# Patient Record
Sex: Male | Born: 1955 | Race: White | Hispanic: No | Marital: Single | Smoking: Former smoker
Health system: Southern US, Community
[De-identification: ages and names within clinical notes are randomized; demographics above are authoritative.]

## PROBLEM LIST (undated history)

## (undated) DIAGNOSIS — I10 Essential (primary) hypertension: Secondary | ICD-10-CM

## (undated) DIAGNOSIS — I639 Cerebral infarction, unspecified: Secondary | ICD-10-CM

## (undated) DIAGNOSIS — F0150 Vascular dementia without behavioral disturbance: Secondary | ICD-10-CM

## (undated) DIAGNOSIS — R4701 Aphasia: Secondary | ICD-10-CM

## (undated) DIAGNOSIS — R1310 Dysphagia, unspecified: Secondary | ICD-10-CM

## (undated) HISTORY — PX: PEG PLACEMENT: SHX5437

---

## 2010-12-12 ENCOUNTER — Emergency Department: Payer: Self-pay | Admitting: Emergency Medicine

## 2011-02-28 ENCOUNTER — Ambulatory Visit: Payer: Self-pay | Admitting: Internal Medicine

## 2011-03-17 ENCOUNTER — Inpatient Hospital Stay: Payer: Self-pay | Admitting: Internal Medicine

## 2011-04-18 ENCOUNTER — Ambulatory Visit: Payer: Self-pay | Admitting: Geriatric Medicine

## 2011-04-23 ENCOUNTER — Emergency Department: Payer: Self-pay | Admitting: Emergency Medicine

## 2011-06-22 ENCOUNTER — Emergency Department: Payer: Self-pay | Admitting: Internal Medicine

## 2011-08-19 ENCOUNTER — Other Ambulatory Visit: Payer: Self-pay | Admitting: Geriatric Medicine

## 2011-08-19 ENCOUNTER — Inpatient Hospital Stay: Payer: Self-pay | Admitting: Internal Medicine

## 2011-09-22 ENCOUNTER — Emergency Department: Payer: Self-pay | Admitting: Emergency Medicine

## 2011-10-17 ENCOUNTER — Emergency Department: Payer: Self-pay | Admitting: *Deleted

## 2011-10-18 ENCOUNTER — Emergency Department: Payer: Self-pay | Admitting: Emergency Medicine

## 2011-10-20 ENCOUNTER — Observation Stay: Payer: Self-pay | Admitting: *Deleted

## 2011-10-20 LAB — CBC
RBC: 4.2 10*6/uL — ABNORMAL LOW (ref 4.40–5.90)
WBC: 13.5 10*3/uL — ABNORMAL HIGH (ref 3.8–10.6)

## 2011-10-20 LAB — COMPREHENSIVE METABOLIC PANEL
Albumin: 3.4 g/dL (ref 3.4–5.0)
Anion Gap: 10 (ref 7–16)
BUN: 29 mg/dL — ABNORMAL HIGH (ref 7–18)
Chloride: 113 mmol/L — ABNORMAL HIGH (ref 98–107)
EGFR (Non-African Amer.): 50 — ABNORMAL LOW
Glucose: 98 mg/dL (ref 65–99)
Osmolality: 309 (ref 275–301)
Potassium: 4.4 mmol/L (ref 3.5–5.1)
Sodium: 153 mmol/L — ABNORMAL HIGH (ref 136–145)
Total Protein: 9.8 g/dL — ABNORMAL HIGH (ref 6.4–8.2)

## 2011-10-21 LAB — CBC WITH DIFFERENTIAL/PLATELET
Basophil #: 0 10*3/uL (ref 0.0–0.1)
Basophil %: 0.3 %
Eosinophil #: 0.1 10*3/uL (ref 0.0–0.7)
Eosinophil %: 0.4 %
HCT: 40.8 % (ref 40.0–52.0)
HGB: 13.7 g/dL (ref 13.0–18.0)
Lymphocyte #: 1.4 10*3/uL (ref 1.0–3.6)
Lymphocyte %: 10.9 %
MCH: 33.9 pg (ref 26.0–34.0)
MCHC: 33.6 g/dL (ref 32.0–36.0)
MCV: 101 fL — ABNORMAL HIGH (ref 80–100)
Monocyte #: 1.3 10*3/uL — ABNORMAL HIGH (ref 0.0–0.7)
Monocyte %: 10 %
Neutrophil #: 9.9 10*3/uL — ABNORMAL HIGH (ref 1.4–6.5)
Neutrophil %: 78.4 %
Platelet: 209 10*3/uL (ref 150–440)
RBC: 4.05 10*6/uL — ABNORMAL LOW (ref 4.40–5.90)
RDW: 13.4 % (ref 11.5–14.5)
WBC: 12.6 10*3/uL — ABNORMAL HIGH (ref 3.8–10.6)

## 2011-10-21 LAB — BASIC METABOLIC PANEL
Anion Gap: 10 (ref 7–16)
BUN: 25 mg/dL — ABNORMAL HIGH (ref 7–18)
Calcium, Total: 9.4 mg/dL (ref 8.5–10.1)
Chloride: 112 mmol/L — ABNORMAL HIGH (ref 98–107)
Co2: 28 mmol/L (ref 21–32)
Creatinine: 1.39 mg/dL — ABNORMAL HIGH (ref 0.60–1.30)
EGFR (African American): >60
EGFR (Non-African Amer.): 56 — ABNORMAL LOW
Glucose: 108 mg/dL — ABNORMAL HIGH (ref 65–99)
Osmolality: 303 (ref 275–301)
Potassium: 3.3 mmol/L — ABNORMAL LOW (ref 3.5–5.1)
Sodium: 150 mmol/L — ABNORMAL HIGH (ref 136–145)

## 2011-10-22 LAB — BASIC METABOLIC PANEL
Anion Gap: 10 (ref 7–16)
BUN: 23 mg/dL — ABNORMAL HIGH (ref 7–18)
Chloride: 106 mmol/L (ref 98–107)
Creatinine: 1.33 mg/dL — ABNORMAL HIGH (ref 0.60–1.30)
EGFR (African American): >60
EGFR (Non-African Amer.): 59 — ABNORMAL LOW

## 2012-03-20 ENCOUNTER — Inpatient Hospital Stay: Payer: Self-pay | Admitting: Internal Medicine

## 2012-03-20 LAB — URINALYSIS, COMPLETE
Bilirubin,UR: NEGATIVE
Glucose,UR: NEGATIVE mg/dL (ref 0–75)
Nitrite: NEGATIVE
Protein: 100
RBC,UR: 11 /HPF (ref 0–5)
WBC UR: 10 /HPF (ref 0–5)

## 2012-03-20 LAB — COMPREHENSIVE METABOLIC PANEL
Albumin: 3.3 g/dL — ABNORMAL LOW (ref 3.4–5.0)
Anion Gap: 12 (ref 7–16)
Bilirubin,Total: 1 mg/dL (ref 0.2–1.0)
Chloride: 110 mmol/L — ABNORMAL HIGH (ref 98–107)
Co2: 23 mmol/L (ref 21–32)
Creatinine: 1.66 mg/dL — ABNORMAL HIGH (ref 0.60–1.30)
EGFR (African American): 53 — ABNORMAL LOW
EGFR (Non-African Amer.): 45 — ABNORMAL LOW
Glucose: 127 mg/dL — ABNORMAL HIGH (ref 65–99)
Osmolality: 297 (ref 275–301)
Potassium: 3.9 mmol/L (ref 3.5–5.1)
SGOT(AST): 28 U/L (ref 15–37)
SGPT (ALT): 18 U/L
Sodium: 145 mmol/L (ref 136–145)

## 2012-03-20 LAB — CBC
MCH: 31.8 pg (ref 26.0–34.0)
Platelet: 256 10*3/uL (ref 150–440)
RBC: 5.28 10*6/uL (ref 4.40–5.90)
RDW: 13.1 % (ref 11.5–14.5)
WBC: 25.1 10*3/uL — ABNORMAL HIGH (ref 3.8–10.6)

## 2012-03-20 LAB — LIPASE, BLOOD: Lipase: 134 U/L (ref 73–393)

## 2012-03-21 LAB — CBC WITH DIFFERENTIAL/PLATELET
Eosinophil %: 0.3 %
HCT: 36 % — ABNORMAL LOW (ref 40.0–52.0)
HGB: 11.7 g/dL — ABNORMAL LOW (ref 13.0–18.0)
Lymphocyte %: 5.6 %
MCV: 99 fL (ref 80–100)
Monocyte #: 1.2 x10 3/mm — ABNORMAL HIGH (ref 0.2–1.0)
Monocyte %: 6 %
Neutrophil #: 17.5 10*3/uL — ABNORMAL HIGH (ref 1.4–6.5)
Neutrophil %: 87.9 %
WBC: 19.9 10*3/uL — ABNORMAL HIGH (ref 3.8–10.6)

## 2012-03-21 LAB — BASIC METABOLIC PANEL
Anion Gap: 4 — ABNORMAL LOW (ref 7–16)
BUN: 22 mg/dL — ABNORMAL HIGH (ref 7–18)
Calcium, Total: 8 mg/dL — ABNORMAL LOW (ref 8.5–10.1)
Chloride: 116 mmol/L — ABNORMAL HIGH (ref 98–107)
Co2: 28 mmol/L (ref 21–32)
Creatinine: 1.27 mg/dL (ref 0.60–1.30)
EGFR (African American): >60
Osmolality: 298 (ref 275–301)

## 2012-03-21 LAB — OCCULT BLOOD X 1 CARD TO LAB, STOOL: Occult Blood, Feces: POSITIVE

## 2012-03-21 LAB — HEMOGLOBIN: HGB: 12 g/dL — ABNORMAL LOW (ref 13.0–18.0)

## 2012-03-21 LAB — CLOSTRIDIUM DIFFICILE BY PCR

## 2012-03-22 LAB — BASIC METABOLIC PANEL
Anion Gap: 9 (ref 7–16)
BUN: 17 mg/dL (ref 7–18)
Chloride: 108 mmol/L — ABNORMAL HIGH (ref 98–107)
Co2: 25 mmol/L (ref 21–32)
Osmolality: 285 (ref 275–301)
Potassium: 3 mmol/L — ABNORMAL LOW (ref 3.5–5.1)
Sodium: 142 mmol/L (ref 136–145)

## 2012-03-22 LAB — CBC WITH DIFFERENTIAL/PLATELET
Basophil #: 0 10*3/uL (ref 0.0–0.1)
Basophil %: 0.4 %
Eosinophil #: 0.1 10*3/uL (ref 0.0–0.7)
HGB: 10.8 g/dL — ABNORMAL LOW (ref 13.0–18.0)
MCH: 34.1 pg — ABNORMAL HIGH (ref 26.0–34.0)
MCHC: 34.8 g/dL (ref 32.0–36.0)
MCV: 98 fL (ref 80–100)
Monocyte #: 0.8 x10 3/mm (ref 0.2–1.0)
Neutrophil %: 82.5 %
Platelet: 148 10*3/uL — ABNORMAL LOW (ref 150–440)
RDW: 12.8 % (ref 11.5–14.5)

## 2012-03-22 LAB — URINE CULTURE

## 2012-03-23 ENCOUNTER — Other Ambulatory Visit: Payer: Self-pay

## 2012-03-23 LAB — CBC WITH DIFFERENTIAL/PLATELET
Basophil #: 0 10*3/uL (ref 0.0–0.1)
Basophil %: 0.2 %
Eosinophil #: 0.1 10*3/uL (ref 0.0–0.7)
HGB: 13.9 g/dL (ref 13.0–18.0)
MCH: 34.1 pg — ABNORMAL HIGH (ref 26.0–34.0)
MCHC: 35.1 g/dL (ref 32.0–36.0)
Monocyte #: 0.9 x10 3/mm (ref 0.2–1.0)
Neutrophil %: 81.1 %
Platelet: 230 10*3/uL (ref 150–440)
RBC: 4.07 10*6/uL — ABNORMAL LOW (ref 4.40–5.90)
RDW: 12.6 % (ref 11.5–14.5)

## 2012-03-23 LAB — BASIC METABOLIC PANEL
BUN: 14 mg/dL (ref 7–18)
Calcium, Total: 8.2 mg/dL — ABNORMAL LOW (ref 8.5–10.1)
Chloride: 106 mmol/L (ref 98–107)
Creatinine: 1.29 mg/dL (ref 0.60–1.30)
EGFR (Non-African Amer.): >60
Glucose: 94 mg/dL (ref 65–99)
Osmolality: 281 (ref 275–301)
Potassium: 4 mmol/L (ref 3.5–5.1)

## 2012-03-23 LAB — STOOL CULTURE

## 2012-05-05 ENCOUNTER — Other Ambulatory Visit: Payer: Self-pay | Admitting: Geriatric Medicine

## 2012-05-05 LAB — CLOSTRIDIUM DIFFICILE BY PCR

## 2013-08-06 ENCOUNTER — Ambulatory Visit: Payer: Self-pay | Admitting: Family Medicine

## 2013-08-25 ENCOUNTER — Emergency Department: Payer: Self-pay | Admitting: Emergency Medicine

## 2013-09-06 IMAGING — CR DG ABDOMEN 1V
1 series · 1 of 1 positions shown · non-contrast
Comparison: none

REASON FOR EXAM: PEG tube placement
COMMENTS:   Bedside (portable):Y

[supine kub]
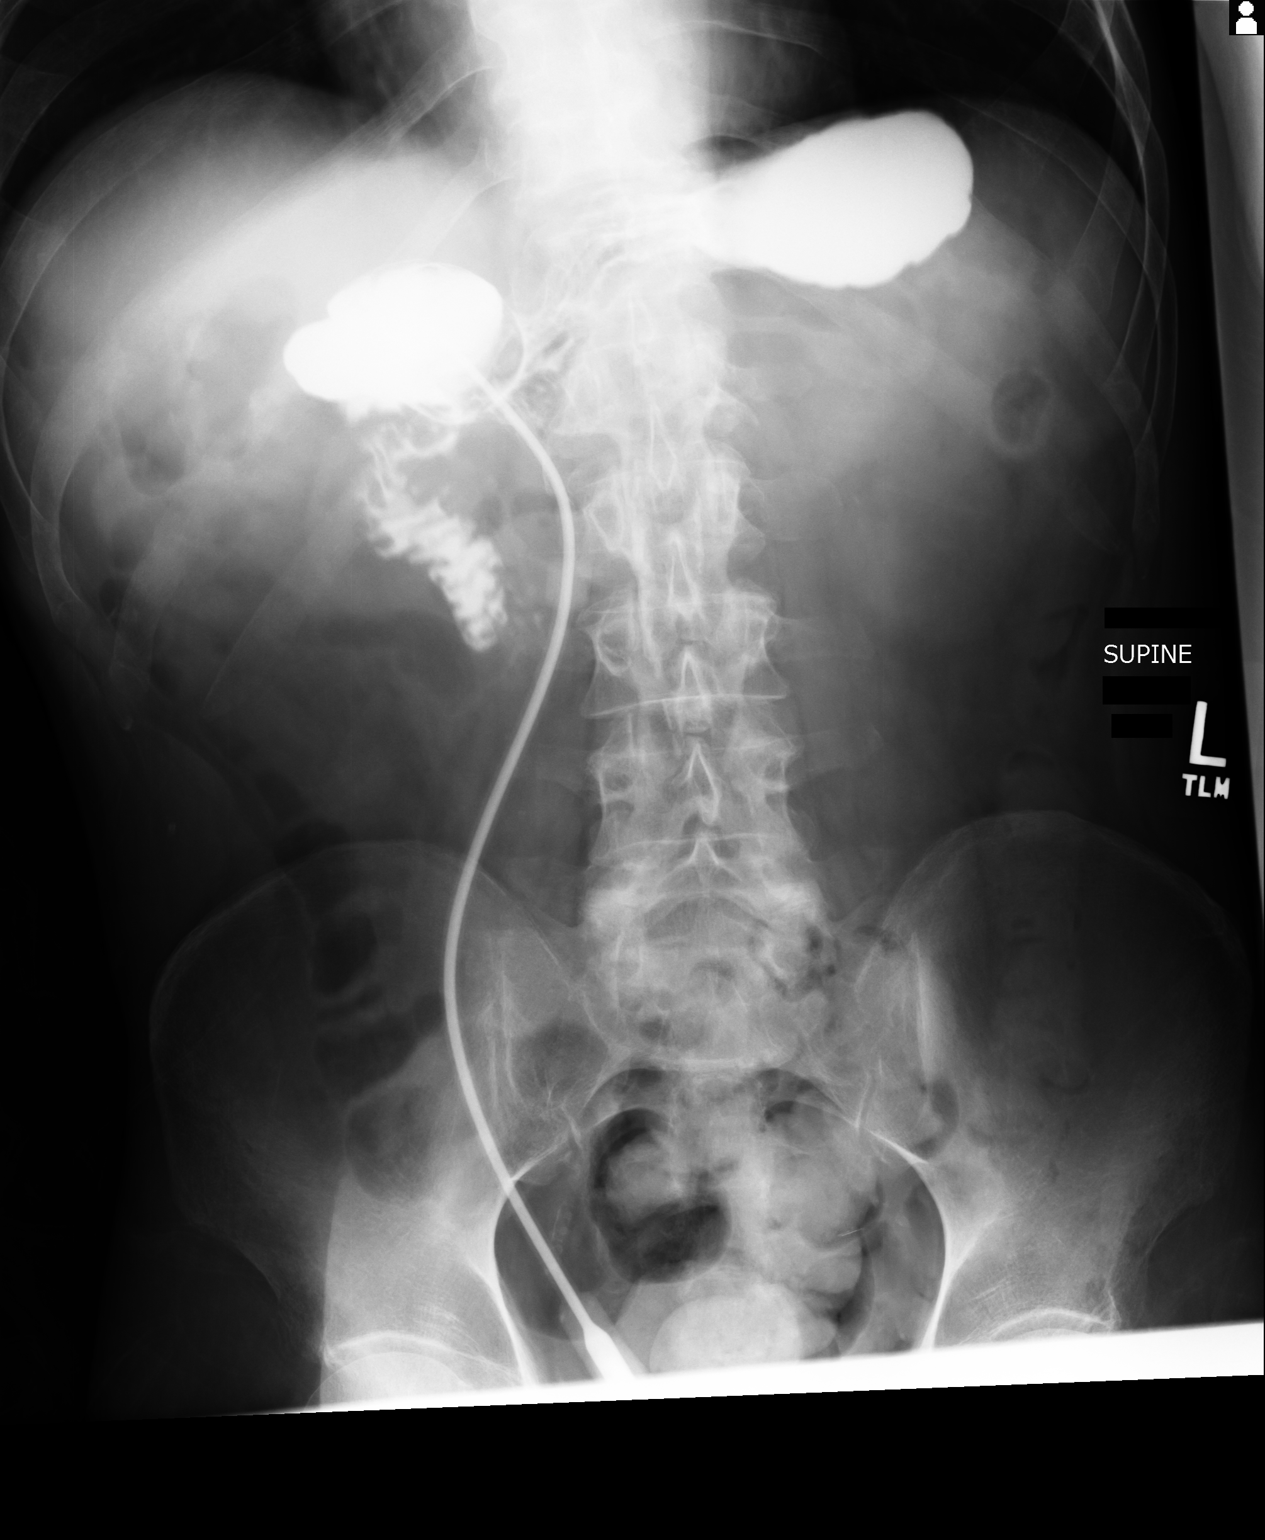

[1 of 1 positions shown; findings below may reference images not displayed]

PROCEDURE:     DXR - DXR KIDNEY URETER BLADDER  - October 18, 2011  [DATE]

RESULT:

Portable AP view of the abdomen was obtained. A gastrostomy tube is again
noted. On this exam, the balloon is projected over the region of the gastric
pylorus. Contrast material has been injected and is visualized in the
stomach and duodenum. No dilated bowel loops suspicious for bowel
obstruction are seen.
IMPRESSION: Please see above.

## 2013-12-02 ENCOUNTER — Ambulatory Visit: Payer: Self-pay

## 2014-12-15 NOTE — Discharge Summary (Signed)
PATIENT NAME:  Willie Waller, Willie Waller MR#:  [id number] DATE OF BIRTH:  Feb 24, 1956  DATE OF ADMISSION:  03/20/2012 DATE OF DISCHARGE:  03/22/2012  ADMISSION DIAGNOSES:  1. Colitis.  2. Rectal bleeding.  DISCHARGE DIAGNOSES:  1. Colitis.  2. Rectal bleeding secondary to colitis.  3. Hypertension.  4. Acute renal failure. 5. Acute respiratory failure.  6. History of cerebrovascular accident with dysphasia and aphasia.  7. Hypernatremia.  8. Hypokalemia.   CONSULTANTS: Lutricia FeilPaul Oh, MD - Gastroenterology.  LABORATORY AND RADIOLOGIC DATA: Sodium 142, potassium 3.0, chloride 108, bicarbonate 25, BUN 17, creatinine 1.20, and glucose 97. White blood cell count 11, hemoglobin 11, hematocrit 32, and platelets 148.  Stool cultures negative. C. difficile is negative.   CT of the abdomen and pelvis was consistent with colitis.   HOSPITAL COURSE: This is a 59 year old male who presented with rectal bleeding and found to have colitis on CT scan. For further details, please refer to the history and physical.  1. Systemic antiinflammatory response syndrome with tachycardia, leukocytosis, and colitis on CT scan. The patient was treated for his colitis, on Levaquin and Flagyl. C. difficile was negative. Gastroenterology consult is appreciated.  2. Rectal bleeding secondary to colitis which has resolved. His hemoglobin has remained stable.  3. Acute renal failure, improved with IV fluids.  4. Acute respiratory failure. Chest x-ray showed atelectasis. He is 98% on 2 liters, likely atelectasis which has resolved.  5. Hypertension, well controlled on Norvasc and metoprolol.  6. History of cerebrovascular accident with dysphagia and aphasia. We appreciate dietary consultation. He will continue with PEG feeds.  7. Hypernatremia from dehydration, improved with D5.  8. Hypokalemia, which was repleted prior to discharge.   DISCHARGE MEDICATIONS:  1. Transderm-Scopolamine 1.5 mg transdermal patch every 3 days. 2. Aspirin  81 mg by G-tube.  3. Calcium with vitamin D 2 tablets three times daily by G-tube. 4. Norvasc 2.5 mg daily.  5. Lexapro 10 mg daily.  6. Lopressor 100 mg twice a day. 7. Peridex 0.2% mucous membrane liquid 1 mL twice a day, 9:00 and 5:00.  8. Hyoscyamine 0.125 mg sublingual three times daily.  9. Flagyl 500 mg every 8 hours through 03/29/2012.  10. Ciprofloxacin 500 mg every 12 hours through 03/29/2012.   DISCHARGE DIET: Osmolite 1.25 RTH, continuous.   DISCHARGE ACTIVITY: As tolerated.   DISCHARGE REFERRAL: Physical therapy.   DISCHARGE FOLLOWUP: Followup with Dr. Glenetta BorgMaria-Dorina Sevilla in one week.  TIME SPENT: 35 minutes.  ____________________________ Janyth ContesSital P. Juliene PinaMody, MD spm:slb D: 03/22/2012 15:02:58 ET     T: 03/22/2012 15:18:41 ET        JOB#: [id number] cc: Deysha Cartier P. Juliene PinaMody, MD, <Dictator> Glenetta BorgMaria-Dorina Sevilla, MD Janyth ContesSITAL P Marianna Cid MD ELECTRONICALLY SIGNED 03/22/2012 15:41

## 2014-12-15 NOTE — Consult Note (Signed)
PATIENT NAME:  Willie Waller, Willie Waller MR#:  [id number] DATE OF BIRTH:  Dec 25, 1955  DATE OF CONSULTATION:  03/21/2012  REFERRING PHYSICIAN:   CONSULTING PHYSICIAN:  Lurline DelShaukat Merikay Lesniewski, MD  REASON FOR CONSULTATION: Bright red blood per rectum, abdominal pain, etc.   HISTORY OF PRESENT ILLNESS: This is a 59 year old male with history of CVA, chronic dysphagia, status post PEG placement, aphasia, history of hypertension, anemia, depression, etc. No history was available from the patient. The patient presented to the Emergency Room where he was noted to have some rectal bleeding. His white cell count was 25,000. He was tachycardic. CT scan of the abdomen and pelvis showed colitis. I was called by Dr. Renae GlossWieting and case was discussed with him over the telephone. The patient was evaluated the next morning. The patient is aphasic and does not communicate verbally. No family members are available for any further history.   PAST MEDICAL HISTORY: 1. Cerebrovascular accident. 2. Chronic dysphagia.  3. Status post PEG placement.  4. Aphasia. 5. Hypertension. 6. Anemia. 7. Depression. 8. Gastroesophageal reflux disease.   ALLERGIES: None.   MEDICATIONS:  1. Aspirin 81 mg a day.  2. Hyoscyamine. 3. Lexapro. 4. Lopressor. 5. Norvasc. 6. Peridex. 7. Scopolamine patch.   [facility] H[facility]ORY[facility] Does not smoke or drink.   FAMILY HISTORY: Positive for hypertension.  REVIEW OF SYSTEMS: Not available.    PHYSICAL EXAMINATION:   GENERAL: Chronically ill appearing male.   VITAL SIGNS: Vitals fairly stable with a temperature of 97, pulse 73, respirations 18, blood pressure 145/76.   HEENT: Otherwise, grossly unremarkable.   LUNGS: Clear to auscultation bilaterally.   CARDIOVASCULAR: Regular rate and rhythm.   ABDOMEN: Some diffuse abdominal tenderness. There is no rebound or guarding. Bowel sounds are positive. Abdominal examination was otherwise unremarkable.   LABORATORY, DIAGNOSTIC,  AND RADIOLOGICAL DATA: White cell count was elevated to 25,000 on admission which gradually came down to 11,000, hemoglobin 10.8, hematocrit 31.1, MCV 98. Stool for occult blood was positive. Stool cultures were negative. C. difficile toxin was negative.   CT scan of abdomen and pelvis showed changes consistent with colitis mainly in the area of the cecum and ascending colon.   ASSESSMENT AND PLAN: The patient is with rectal bleeding, leukocytosis, CT scan that is concerning for colitis. The patient himself does not give much history. I will treat him as possible infectious colitis. The patient did have some rectal bleeding although no further signs of bleeding in the hospital and his hemoglobin and hematocrit remain stable. I have recommended Cipro and Flagyl for suspected colitis, stool cultures, C. difficile toxin, and stool WBCs. C. difficile toxin came back negative as well as stool cultures.    The patient was signed out to Dr. Bluford Kaufmannh for him to follow the next day. The patient has been discharged at the time of this discussion after being treated for colitis on p.o. antibiotics.   ____________________________ Lurline DelShaukat Terius Jacuinde, MD si:drc D: 03/28/2012 18:20:39 ET T: 03/29/2012 09:59:48 ET JOB#: [id number]  cc: Lurline DelShaukat Welby Montminy, MD, <Dictator> Lurline DelSHAUKAT Malajah Oceguera MD ELECTRONICALLY SIGNED 03/29/2012 13:24

## 2014-12-15 NOTE — Consult Note (Signed)
Chief Complaint:   Subjective/Chief Complaint Covering for Dr. Suzette Battiest who saw patient yesterday but no notes written? Colitis on CT. Pt nonverbal. Tolerating TF. No active bleeding. On Abx.   VITAL SIGNS/ANCILLARY NOTES: **Vital Signs.:   26-Jul-13 13:32   Vital Signs Type Routine   Temperature Temperature (F) 98.6   Celsius 37   Temperature Source Oral   Pulse Pulse 69   Respirations Respirations 20   Systolic BP Systolic BP 604   Diastolic BP (mmHg) Diastolic BP (mmHg) 71   Mean BP 86   Pulse Ox % Pulse Ox % 99   Pulse Ox Activity Level  At rest   Oxygen Delivery 2L   Brief Assessment:   Cardiac Regular    Respiratory clear BS    Gastrointestinal Mild RLQ tenderness   Lab Results: Routine Chem:  26-Jul-13 06:07    Glucose, Serum 97   BUN 17   Creatinine (comp) 1.20   Sodium, Serum 142   Potassium, Serum  3.0   Chloride, Serum  108   CO2, Serum 25   Calcium (Total), Serum  7.6   Anion Gap 9   Osmolality (calc) 285   eGFR (African American) >60   eGFR (Non-African American) >60 (eGFR values <74m/min/1.73 m2 may be an indication of chronic kidney disease (CKD). Calculated eGFR is useful in patients with stable renal function. The eGFR calculation will not be reliable in acutely ill patients when serum creatinine is changing rapidly. It is not useful in  patients on dialysis. The eGFR calculation may not be applicable to patients at the low and high extremes of body sizes, pregnant women, and vegetarians.)  Routine Hem:  26-Jul-13 06:07    WBC (CBC)  11.0   RBC (CBC)  3.18   Hemoglobin (CBC)  10.8   Hematocrit (CBC)  31.1   Platelet Count (CBC)  148   MCV 98   MCH  34.1   MCHC 34.8   RDW 12.8   Neutrophil % 82.5   Lymphocyte % 8.8   Monocyte % 7.0   Eosinophil % 1.3   Basophil % 0.4   Neutrophil #  9.1   Lymphocyte # 1.0   Monocyte # 0.8   Eosinophil # 0.1   Basophil # 0.0 (Result(s) reported on 22 Mar 2012 at 07:58AM.)   Radiology  Results: CT:    24-Jul-13 20:42, CT Abdomen and Pelvis Without Contrast   CT Abdomen and Pelvis Without Contrast    REASON FOR EXAM:    (1) per admitting team:please do ct with peg contrast   only and will consider iv  COMMENTS:       PROCEDURE: CT  - CT ABDOMEN AND PELVIS W0  - Mar 20 2012  8:42PM     RESULT: CT of the abdomen and pelvis is performed with oral contrast only   and reconstructed in the axial plane at 3.0 mm slice thickness. There is   no previous study for comparison.    There is abnormal thickening of the wall of the cecum and ascending colon   concerning for colitis. There is short segment ofthickening in the   proximal transverse colon. There may be some slight thickening of what   appears to be the distal sigmoid colon in the area of image 100 in the   posterior left pelvis. There significant motion artifact reducing     sensitivity. Definite small bowel abnormal distention is not appreciated   area unopacified loops of bowel are present.  Percutaneous gastrostomy   tube is present with tip projecting in the duodenum. No free air is   appreciated. No significant ascites is evident. The kidneys show no   obstruction or definite stone. Atherosclerotic calcification is present.   There is low-attenuation posteriorly in the mid right kidney with a   Hounsfield reading 11 suggestive of a cyst measuring approximately 1.6 cm   diameter. No radiopaque gallstones are evident. The liver, spleen and   pancreas appear grossly normal. No definite adenopathy is appreciated.   Prominent atherosclerotic calcification is present. The terminal ileum is   difficult to evaluate. There is some atelectasis versus minimal   infiltrate in the right lower lobe. The lungs are otherwise clear. Some   respiratory motion artifact is present.    IMPRESSION:   1. Changes consistent with colitis. Some minimal areas of enteritis   cannot be completely excluded. No definite pneumatosis, abscess  or   perforation is present.  2. Small right renal cyst.  3. Atherosclerotic disease.  4. Foley balloon is present with small amount of air in the urinary   bladder.  5. Right lung base atelectasis versus minimal infiltrate.    Dictation Site: 6          Verified By: Sundra Aland, M.D., MD   Assessment/Plan:  Assessment/Plan:   Assessment Colitis on CT. No active bleeding now.    Plan Can be returned to NH on Abx for 7-10days,. Can f/u in GI afterwards. If symptomatic, then colnoscopy later as outpt. Thanks.   Electronic Signatures: Verdie Shire (MD)  (Signed 26-Jul-13 14:09)  Authored: Chief Complaint, VITAL SIGNS/ANCILLARY NOTES, Brief Assessment, Lab Results, Radiology Results, Assessment/Plan   Last Updated: 26-Jul-13 14:09 by Verdie Shire (MD)

## 2014-12-15 NOTE — H&P (Signed)
PATIENT NAME:  Willie Waller, Willie Waller MR#:  [id number] DATE OF BIRTH:  [birth year]  DATE OF ADMISSION:  03/20/2012  PRIMARY CARE PHYSICIAN:  [facility]evilla, [facility].   CHIEF COMPLAINT: Sent in for bright red blood per rectum.   HISTORY OF PRESENT ILLNESS: This is a 59 year old man with history of cerebrovascular accident, chronic dysphagia, status post PEG placement, aphasia, history of hypertension, anemia, depression, B12 deficiency sent in for bright red blood per rectum. I am unable to get any history from the patient. In the Emergency Room did have a white count of 25,000, tachycardic when he came in and found to be in acute renal failure. He had a CT scan of the abdomen and pelvis with oral contrast only that showed changes consistent with colitis. I do [facility]. Hospitalist services were contacted for further evaluation.   PAST MEDICAL HISTORY:  1. Cerebrovascular accident.  2. Chronic dysphagia status post PEG. 3. Aphagia. 4. Hypertension. 5. Anemia. 6. Depression. 7. B12 deficiency.  8. Gastroesophageal reflux disease.   PAST SURGICAL HISTORY: PEG tube placement.   ALLERGIES: No known drug allergies.   MEDICATIONS:  1. Aspirin 81 mg daily.  2. Calcium and vitamin D 2 tablets G-tube daily.  3. Colace G-tube daily.  4. Hyoscyamine 0.125 mg sublingually three times daily.  5. Lexapro 10 mg daily.  6. Lopressor 100 mg twice a day. 7. Norvasc 2.5 mg daily.  8. Peridex 0.125 mg twice a day.  9. Scopolamine patch transdermal every three days.   [facility] HISTORY[facility] No alcohol or tobacco abuse.   FAMILY HISTORY: Positive for hypertension.   REVIEW OF SYSTEMS: Unable to elicit secondary to nonverbal.   PHYSICAL EXAMINATION:    VITAL SIGNS: On presentation included: Temperature 98.5, pulse 125, respirations 16, blood pressure 118/76, pulse oximetry 86% on room air.   GENERAL: No respiratory distress.    EYES: Conjunctivae and lids normal. Pupils equal, round, and reactive to light. Extraocular muscles intact. No nystagmus.   EARS, NOSE, MOUTH, AND THROAT: Tympanic membrane: No erythema. Nasal mucosa: No erythema. Throat dry. Lips crusting.   NECK: No JVD. No bruits. No lymphadenopathy. No thyromegaly. No thyroid nodules palpated.   LUNGS: Lungs are clear to auscultation. No use of accessory muscles to breathe. No rhonchi, rales, or wheeze heard.   HEART: S1 and S2 normal. No gallops, rubs, or murmurs heard. Carotid upstroke 2+ bilaterally. No bruits.   EXTREMITIES: Dorsalis pedis pulses are 1+ bilaterally. No edema of the lower extremity.   ABDOMEN: Soft, nontender. No organomegaly/splenomegaly. Normoactive bowel sounds. No masses felt.   RECTAL: Exam done by ER physician is guaiac-positive brown stool.   LYMPHATIC: No lymph nodes in the neck.   MUSCULOSKELETAL: No clubbing, edema, or cyanosis.   SKIN: No rashes seen. Unable to examine him posteriorly by myself.   NEUROLOGIC: The patient seems like he understands what I am saying. Does shake his head yes or no to some simple questions. Does follow some simple commands. Cranial nerves II through XII are grossly intact but unable to talk. The patient is able to move lower extremities and upper extremities.   PSYCHIATRIC: Unable to test secondary to the patient is not able to talk.   LABORATORY, RADIOLOGICAL AND DIAGNOSTIC DATA: CT scan of the abdomen with oral contrast showed changes consistent with colitis, small right renal cyst. Foley balloon with air in the urinary bladder. Right lung base  atelectasis versus infiltrate. Urinalysis: Trace leukocyte esterase. Lactic acid 1.2, glucose 127, BUN 31, creatinine 1.66, sodium 145, potassium 3.9, chloride 110, CO2 23, calcium 9.5. Liver function tests: Total protein elevated at 9.2, albumin 3.3. GFR 45. White blood cell count 25.1, hemoglobin and hematocrit 16.8 and 52.9, platelet count 256.  INR 1.0. Chest x-ray negative.   ASSESSMENT AND PLAN:  1. Systemic inflammatory response syndrome with tachycardia, leukocytosis, found to have colitis on CT scan. We will give empiric Levaquin and Flagyl. We will send off stool studies. Gastroenterology consult with Dr. Niel Hummer. IV fluid hydration.  2. Rectal bleeding. Will hold aspirin at this time. Continue to follow-up serial hemoglobins. The patient is dehydrated hemoconcentrated at this point with his poor urine output. Continue to watch creatinine closely.  3. Acute renal failure and dehydration. Will give IV fluid hydration. Continue to monitor closely.  4. Acute respiratory failure with pulse oximetry of 86% on presentation. Will give oxygen supplementation. Chest x-ray is negative. The patient is not in any respiratory distress, unclear etiology at this point. The patient was poor reading initially. I am not sure if this is real or not. Will continue to monitor during the hospital course.  5. Hypertension. Continue metoprolol and Norvasc.  6. History of cerebrovascular accident with aphasia. Will get dietary to restart PEG tubes in the a.m.   The case discussed with the patient's daughter. The patient is a DO NOT RESUSCITATE.  TIME SPENT ON ADMISSION: 50 minutes.    ___________________________ Herschell Dimes. Renae Gloss, MD rjw:ap D: 03/20/2012 21:42:25 ET T: 03/21/2012 07:23:41 ET JOB#: [id number]  cc: Herschell Dimes. Renae Gloss, MD, <Dictator> Glenetta Borg, MD Salley Scarlet MD ELECTRONICALLY SIGNED 03/25/2012 17:19

## 2014-12-18 NOTE — Consult Note (Signed)
PATIENT NAME:  Willie Waller, Reda MR#:  [id number] DATE OF BIRTH:  1955-10-01  DATE OF CONSULTATION:  08/25/2013  REFERRING PHYSICIAN:   CONSULTING PHYSICIAN:  Keyasha Miah A. Saadia Dewitt, MD  REASON FOR CONSULTATION: Inadvertent G-tube removal.   HISTORY OF PRESENT ILLNESS:  Mr. Willie Waller is a pleasant 59 year old with history of CVA and stroke who has had a gastrostomy tube to assist with feedings since 2012. He had it inadvertently removed in the past and had to have a replacement. He, according to EMS report, had accidentally pulled his tube out 2 days ago and initial thoughts were to leave it out per his family and now they would like it back in. Otherwise, the patient is unable to assess review of systems.   PAST MEDICAL HISTORY:  1.  CVA, stroke.  2.  Hyperlipidemia.  3.  Dysphagia.  4.  Anemia.  5.  Depression.  6.  GERD.  7.  Hypertension.  8.  Hepatitis C.  9.  G-tube placement.   MEDICATIONS:  1.  Scopolamine patch.  2.  Peridex. 3.  Lopressor.  4.  Lexapro.  5.  Imodium.  6.  Hyoscyamine.  7.  Erythromycin ophthalmologic ointment.   8.  Depakote.  9.  Caltrate.  10.  Aspirin 81.  11.  Tylenol with Codeine.   ALLERGIES: No known drug allergies.   REVIEW OF SYSTEMS: Unable to obtain.   PHYSICAL EXAMINATION: VITAL SIGNS: Temperature 97.9, pulse 62, blood pressure 126/66, respirations 18.  GENERAL: No acute distress, interactive, appears to follow.  HEAD: Normocephalic, atraumatic.  EYES: No scleral icterus. No conjunctivitis.  CHEST: Lungs clear to auscultation, moving air well.  HEART: Regular rate and rhythm. No murmurs, rubs or gallops.   ABDOMEN: Soft, nontender, nondistended. G-tube site with some granulation, no obvious drainage.  EXTREMITIES: Moves all extremities well. Strength 5/5.  NEURO:  Cranial nerves II through XII grossly intact.   LABORATORY DATA: Unremarkable.   ASSESSMENT AND PLAN: Mr. Willie Waller is a pleasant 59 year old male who is in need for  gastrostomy tube replacement. I have already begun serially dilating his gastrostomy tube with an 8-French.  Will continue to progress until an 18-French and will proceed from there.  Will need gastrostomy tube study after dilation.   More than 30 min were spent performing dilations.  ER doc performed final dilation and replacement with 18 F foley catheter under my instruction.  He performed and interpreted the gastrostomy tube contrast study prior to return to SNF.  ____________________________ Si Raiderhristopher A. Malaka Ruffner, MD cal:cs D: 08/25/2013 17:43:17 ET T: 08/25/2013 18:22:00 ET JOB#: [id number]  cc: Cristal Deerhristopher A. Akiel Fennell, MD, <Dictator> Jarvis NewcomerHRISTOPHER A Aislyn Hayse MD ELECTRONICALLY SIGNED 08/26/2013 14:34

## 2014-12-20 NOTE — Consult Note (Signed)
PATIENT NAME:  Willie Waller, Willie MR#:  Waller DATE OF BIRTH:  April 03, 1956  DATE OF CONSULTATION:  10/21/2011  REFERRING PHYSICIAN:   CONSULTING PHYSICIAN:  Ezzard StandingPaul Y. Uma Jerde, MD  REASON FOR REFERRAL: Possible PEG placement.   HISTORY OF PRESENT ILLNESS: The patient is a 59 year old white male with a known history of stroke and chronic dysphagia who is unable to swallow and take any medications. The patient has had PEG placement in the past. He has had multiple ER visits because of pulling out his PEG tube. He was here 01/25, 02/19, and 10/18/2011. Each time the PEG tube was replaced. Unfortunately, the patient came in yesterday. By the time he was seen in the Emergency Room, the G-tube was already closed and they were not able to place a new tube in. Therefore, the patient was admitted for observation and possible replacement of the G-tube.   The patient is nonverbal so it is unclear whether he is having any particular symptoms, but he did complain to the hospitalist about some mild abdominal pain. There are no other complaints.   REVIEW OF SYSTEMS: Review could not be obtained because he is nonverbal.   PAST MEDICAL HISTORY:  1. Hypertension. 2. Depression. 3. History of stroke. 4. Anemia.  5. He was last admitted in December 2012 for pneumonia, renal failure, and SIRS.   PAST SURGICAL HISTORY: Notable for PEG placement.   DRUG ALLERGIES: He apparently has no allergies to medication.   HOME MEDICATIONS: He was getting Lexapro, baby aspirin, Norvasc, Caltrate, Colace, Lopressor, atropine drops, Levsin, and DuoNebs.   SOCIAL HISTORY: There is no prior alcohol or tobacco history.   FAMILY HISTORY: Positive for hypertension.   PHYSICAL EXAMINATION:   GENERAL: The patient is in no acute distress.  VITALS: This morning his temperature is 97.6, pulse 96, respirations 18, blood pressure 162/89, and pulse is 96.   HEENT: Normocephalic, atraumatic head. Pupils are equally reactive. Throat was  clear.   NECK: Supple.   HEART: Regular rate and rhythm without murmurs.   LUNGS: Clear bilaterally.   ABDOMEN: Normoactive bowel sounds. Soft. Appears to be nontender. There is a scar from the G-tube, but it is already closed. There is no hepatomegaly. There are no palpable masses.   EXTREMITIES: No clubbing, cyanosis, or edema.   LABS/STUDIES: Today sodium is 150, potassium 3.3, chloride 112, BUN 25, creatinine 1.39, and glucose 108. Liver enzymes from yesterday were normal, except for AST of 47. White count is 12.6, hemoglobin 13.7, and platelet count 209.   ASSESSMENT AND PLAN: This is a patient with chronic dysphagia from stroke. He needs to medication and food through the G-tube. This has been pulled out. We will set him up for endoscopy with PEG placement this morning. The patient will be given Ancef prior to the procedure. We will also get consent from his family members.   Thank you for the referral.  ____________________________ Ezzard StandingPaul Y. Bluford Kaufmannh, MD pyo:slb D: 10/21/2011 10:27:17 ET     T: 10/21/2011 10:53:44 ET       JOB#: [id number] Ezzard StandingPAUL Y Parys Elenbaas MD ELECTRONICALLY SIGNED 10/26/2011 8:33

## 2014-12-20 NOTE — Consult Note (Signed)
New 20 Fr G tube placed at previous G site. Can start using meds through G tube now. In 6 hrs, can start TF through G tube. If tolerated, then can be discharged by tomorrow. Thanks.  Electronic Signatures: Lutricia Feilh, Graciano Batson (MD)  (Signed on 23-Feb-13 11:02)  Authored  Last Updated: 23-Feb-13 11:02 by Lutricia Feilh, Shenia Alan (MD)

## 2014-12-20 NOTE — H&P (Signed)
PATIENT NAME:  Willie Waller, Willie Waller MR#:  [id number] DATE OF BIRTH:  01-24-1956  DATE OF ADMISSION:  10/20/2011  PRIMARY CARE PHYSICIAN: Dr Benito Mccreedy at [facility].   CHIEF COMPLAINT: The patient was sent from [facility] because he pulled out his PEG tube. The patient is nonverbal and cannot provide any history.   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 He had multiple ER visits because of pulling out his PEG tube. He was here on 09/22/2011.  His PEG tube was replaced.  Then again on 10/17/2011 and 10/18/2011 when he pulled out his PEG tube, his PEG tube was replaced.  Today, again, he was sent because he pulled out his PEG tube.  The PEG tube was unable to be replaced in the emergency room.  The ER physician talked to the GI on call and they suggested admission in the hospital to replace the PEG tube.    Patient is complaining of mild abdominal pain when you ask him but he is nonverbal, very poor historian. He denies any complaints otherwise. No chest pain or shortness of breath. No diarrhea. No fever. No urinary or bowel complaints.   REVIEW OF SYSTEMS:  Not possible as the patient is nonverbal.   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 The patient was last admitted on 08/19/2011 and was discharged on 08/21/2012 for systemic inflammatory response syndrome, pneumonia, and acute renal failure.   PAST SURGICAL HISTORY: PEG tube placement.   ALLERGIES TO MEDICATIONS: None.   HOME MEDICATIONS: As per records from the nursing home, include a puree diet,  nectar-thickened liquids.  He is on PEG feed 2 cal HN at 50 mL per hour for 16 hours a day.  Lexapro 20 mg via the G-tube daily. Aspirin 81 mg by the G-tube daily. He is on Norvasc 2.5 mg daily. Caltrate 2 tablets via G-tube daily. Docusate 10 mL by G-tube twice a day. Peridex for oral solution.  Lopressor 100 mg via the G-tube twice a day. Atropine 2 drops under the tongue 3 times a day for hypersecretions.  Hyoscyamine 0.125 mg sublingual 3 times a day for dyspepsia.  DuoNeb q.4 hours p.r.n.    SOCIAL HISTORY: As per previous records.  Negative for alcohol and tobacco use. He resides at [facility] skilled nursing facility.   FAMILY HISTORY: Per old records.  Positive for hypertension.   PHYSICAL EXAMINATION:  VITAL SIGNS: When he presented to the emergency room, temperature of 98.9, heart rate of 109, respiratory rate 18, blood pressure 169/96, saturating 97% on room air.   GENERAL: This is a middle-aged Caucasian male comfortably lying in bed, no acute distress.   HEENT: Bilateral pupils are equal. Extraocular muscles intact. No scleral icterus. No conjunctivitis. Oral mucosa is moist.  Has very poor oral hygiene.   NECK: No thyroid tenderness, enlargement or nodules. Neck is supple. No masses, nontender. No adenopathy. No JVD. No carotid bruit.   CHEST: Bilateral breath sounds are clear. No wheeze. Normal effort. No respiratory distress.   HEART: Heart sounds are regular. No murmur. Good peripheral pulses.   ABDOMEN: Appears to be soft, nontender. There is a stoma where the PEG tube was in  without any discharge. Normal bowel sounds. No hepatomegaly. No bruit. No masses appreciable.   RECTAL: Deferred.   NEUROLOGIC: He is awake. He is nonverbal. Could not assess orientation.  He is moving all extremities against gravity.   EXTREMITIES: No cyanosis. No clubbing.   SKIN: No rash. No lesions.   LABORATORY WORK: Right now his white count shows 13.5, hemoglobin of 14.3, platelet count  of 220,000. His BMP is pending at this time.   IMPRESSION:  1. Displaced or pulled out percutaneous endoscopic gastrostomy tube, needs to be replaced.  2. History of cerebrovascular accident.   3. Chronic dysphagia.  4. Hypertension.  5. Depression.   PLAN: A 59 year old male who had previous cerebrovascular accident. He is nonverbal. [facility]. He has chronic dysphagia. He has a PEG tube. He had pulled out his  PEG tube multiple times.  Needs to be replaced. He was here in the emergency room on 10/17/2011, 10/18/2011, and now he comes back with pulled out PEG tube. This time his PEG tube could not be placed in the emergency room so he needs to be admitted for GI to replace the PEG tube.  I will put in a GI consult.  I will give him some IV hydration at this time. We cannot give his oral medications because his PEG tube is out. I will put him on some IV hydralazine p.r.n. if his systolic blood pressure is 180.  We will keep him n.p.o. post midnight for PEG tube placement tomorrow.  I will admit him to observation.  His CMP needs to be followed.    TIME SPENT ON ADMISSION AND COORDINATION:  35 minutes.     ____________________________ Fredia SorrowAbhinav Ibrahima Holberg, MD ag:vtd D: 10/20/2011 21:09:57 ET T: 10/21/2011 08:19:11 ET JOB#: [id number]  cc: Fredia SorrowAbhinav Aviyana Sonntag, MD, <Dictator> Glenetta BorgMaria-Dorina Sevilla, MD Fredia SorrowABHINAV Josaphine Shimamoto MD ELECTRONICALLY SIGNED 10/31/2011 12:26

## 2014-12-20 NOTE — Consult Note (Signed)
Full consult to follow. Pt with hx of CVA. Unable to swallow on own. Pt nonverbal. Keeps pulling G tube. Unable to take meds on own. G tube site has completely closed. Will get consent from family. Will give ancef IV prophylactically and plan EGD with PEG this AM. Thanks.  Electronic Signatures: Lutricia Feilh, Chicquita Mendel (MD)  (Signed on 23-Feb-13 08:32)  Authored  Last Updated: 23-Feb-13 08:32 by Lutricia Feilh, Timoty Bourke (MD)

## 2014-12-20 NOTE — H&P (Signed)
PATIENT NAME:  Willie Waller, Willie Waller MR#:  [id number] DATE OF BIRTH:  11-Sep-1955  DATE OF ADMISSION:  10/20/2011  ADDENDUM  His complete metabolic panel came back. His sodium is 153 and his creatinine is 1.54. So he is hypernatremic and some component of acute renal failure also. His creatinine on 12/25 was also 1.5. He may have underlying CKD also. So I am going to start him on D5 water for hypernatremia and renal failure and follow-up his BMP in the morning.    ____________________________ Fredia SorrowAbhinav Buckley Bradly, MD ag:ap D: 10/20/2011 21:20:33 ET T: 10/21/2011 08:19:33 ET JOB#: [id number]  cc: Fredia SorrowAbhinav Shermar Friedland, MD, <Dictator> Fredia SorrowABHINAV Daviyon Widmayer MD ELECTRONICALLY SIGNED 10/31/2011 12:23

## 2014-12-20 NOTE — Consult Note (Signed)
Chief Complaint:   Subjective/Chief Complaint No complaints. No abd paiin. On TF at 60cc/hr and tolerating iit well.   VITAL SIGNS/ANCILLARY NOTES: **Vital Signs.:   24-Feb-13 08:43   Pulse Pulse 77   Respirations Respirations 18   Systolic BP Systolic BP 124   Diastolic BP (mmHg) Diastolic BP (mmHg) 80   Mean BP 94   Pulse Ox % Pulse Ox % 96   Pulse Ox Activity Level  At rest   Oxygen Delivery Room Air/ 21 %   Brief Assessment:   Cardiac Regular    Respiratory clear BS    Gastrointestinal Normal  G site intact. Non tender   Routine Chem:  24-Feb-13 05:29    Glucose, Serum 85   BUN 23   Creatinine (comp) 1.33   Sodium, Serum 145   Potassium, Serum 3.8   Chloride, Serum 106   CO2, Serum 29   Calcium (Total), Serum 8.7   Osmolality (calc) 292   eGFR (African American) >60   eGFR (Non-African American) 59   Anion Gap 10   Assessment/Plan:  Assessment/Plan:   Assessment S/P PEG placement. Stable.    Plan ok for discharge. Will siign off. Thanks.   Electronic Signatures: ,  (MD)  (Signed 24-Feb-13 09:23)  Authored: Chief Complaint, VITAL SIGNS/ANCILLARY NOTES, Brief Assessment, Lab Results, Assessment/Plan   Last Updated: 24-Feb-13 09:23 by ,  (MD) 

## 2014-12-20 NOTE — Discharge Summary (Signed)
PATIENT NAME:  Willie Waller, Willie Waller MR#:  [id number] DATE OF BIRTH:  July 10, 1956  DATE OF ADMISSION:  10/20/2011 DATE OF DISCHARGE:  10/22/2011  DISCHARGE DIAGNOSES:  1. Displaced PEG tube status post replacement.  2. Hypertension.  3. Acute renal failure, resolved.  4. Hypernatremia, resolved. 5. History of cerebrovascular accident, chronic dysphagia, and depression.  CONSULTANTS: Lutricia FeilPaul Oh, MD - Gastroenterology.   PROCEDURES: Upper GI endoscopy and PEG tube replacement.  HOSPITAL COURSE: This is a 59 year old male who [facility] who has history of cerebrovascular accident and chronic dysphagia status post PEG tube, hypertension, anemia, depression, and gastroesophageal reflux disease. The patient has had multiple ER visits because he pulls out his PEG tube. He was in the ER on 09/22/2011 and then on 02/19 and 10/18/2011. Then he was sent back on 10/20/2011 because he pulled out his PEG tube again. It could not be replaced in the emergency room, so the patient was admitted for displaced PEG tube. Also at admission, the patient was found to be having some renal failure with a creatinine of 1.54 and hypernatremic with a sodium of 153. The patient was given D5 water. His sodium has improved to 145 and his creatinine has improved to 1.33. Also he had some elevated white count, at 13.5, which has improved to 12.6. His CBC and BMP should be monitored as an outpatient. Gastroenterology was consulted. He had an upper GI endoscopy done on 10/21/2011 which showed a normal esophagus, stomach, and duodenum, and an externally removable PEG tube was successfully placed. The patient was started on PEG medications and feeds yesterday and he is tolerating his PEG feeds well. An abdominal binder can be placed to prevent him from pulling out his PEG tube so often.   DISCHARGE HOME MEDICATIONS:  1. Docusate 10 mL twice a day via PEG tube. 2. Atropine two drops sublingual three times daily. 3. Peridex solution  0.12% solution to swab the gums twice a day. 4. Lexapro 20 mg via PEG tube once a day. 5. Aspirin 81 mg via PEG tube once a day. 6. Norvasc 2.5 mg via PEG once a day. 7. Calcium 600 plus D via PEG twice a day. 8. Lopressor 100 mg via PEG twice daily. 9. Hyoscyamine 0.125 sublingual three times daily. 10. DuoNebs every four hours as needed for shortness of breath and wheezing.   DIET: Suction every four hours p.r.n. for congestion. Nectar thickened liquids for pleasure feeding. Pureed diet for pleasure feeding. Free water flushes 240 mL every 6 hours via PEG. 2 cal HN via PEG at 60 mL/hour for 16 hours a day. Aspiration precautions.   DISCHARGE INSTRUCTIONS: Please follow CBC and BMP. If his sodium is elevated, then free water can be advanced. Follow[facility]. Follow-up CBC and BMP in one week.  The patient also had a chest x-ray done at the time of admission which only showed hyperinflation, otherwise negative.   TIME SPENT ON DISCHARGE: 50 minutes. ____________________________ Fredia SorrowAbhinav Nemiah Bubar, MD ag:slb D: 10/22/2011 14:45:03 ET T: 10/22/2011 15:06:08 ET JOB#: [id number]  cc: Fredia SorrowAbhinav Baylie Drakes, MD, <Dictator> Willie BorgMaria-Dorina Sevilla, MD Fredia SorrowABHINAV Sudais Banghart MD ELECTRONICALLY SIGNED 10/28/2011 14:51

## 2016-09-10 ENCOUNTER — Other Ambulatory Visit
Admission: RE | Admit: 2016-09-10 | Discharge: 2016-09-10 | Disposition: A | Discharge status: Home / Self Care | Payer: Medicaid Other | Source: Skilled Nursing Facility | Attending: Family Medicine | Admitting: Family Medicine

## 2016-09-10 DIAGNOSIS — R1110 Vomiting, unspecified: Secondary | ICD-10-CM | POA: Diagnosis present

## 2016-09-10 LAB — CBC WITH DIFFERENTIAL/PLATELET
Basophils Absolute: 0 10*3/uL (ref 0–0.1)
Basophils Relative: 0 %
EOS ABS: 0 10*3/uL (ref 0–0.7)
EOS PCT: 0 %
HCT: 53.1 % — ABNORMAL HIGH (ref 40.0–52.0)
Hemoglobin: 18.4 g/dL — ABNORMAL HIGH (ref 13.0–18.0)
LYMPHS ABS: 0.9 10*3/uL — AB (ref 1.0–3.6)
Lymphocytes Relative: 6 %
MCH: 34.3 pg — AB (ref 26.0–34.0)
MCHC: 34.7 g/dL (ref 32.0–36.0)
MCV: 98.9 fL (ref 80.0–100.0)
MONOS PCT: 10 %
Monocytes Absolute: 1.4 10*3/uL — ABNORMAL HIGH (ref 0.2–1.0)
Neutro Abs: 11.5 10*3/uL — ABNORMAL HIGH (ref 1.4–6.5)
Neutrophils Relative %: 84 %
PLATELETS: 238 10*3/uL (ref 150–440)
RBC: 5.37 MIL/uL (ref 4.40–5.90)
RDW: 13 % (ref 11.5–14.5)
WBC: 13.9 10*3/uL — ABNORMAL HIGH (ref 3.8–10.6)

## 2016-09-10 LAB — COMPREHENSIVE METABOLIC PANEL
ALT: 26 U/L (ref 17–63)
ANION GAP: 13 (ref 5–15)
AST: 48 U/L — ABNORMAL HIGH (ref 15–41)
Albumin: 4.5 g/dL (ref 3.5–5.0)
Alkaline Phosphatase: 88 U/L (ref 38–126)
BUN: 47 mg/dL — ABNORMAL HIGH (ref 6–20)
CALCIUM: 10 mg/dL (ref 8.9–10.3)
CHLORIDE: 103 mmol/L (ref 101–111)
CO2: 28 mmol/L (ref 22–32)
Creatinine, Ser: 2.04 mg/dL — ABNORMAL HIGH (ref 0.61–1.24)
GFR calc non Af Amer: 34 mL/min — ABNORMAL LOW (ref >60)
GFR, EST AFRICAN AMERICAN: 39 mL/min — AB (ref >60)
Glucose, Bld: 109 mg/dL — ABNORMAL HIGH (ref 65–99)
POTASSIUM: 3.8 mmol/L (ref 3.5–5.1)
SODIUM: 144 mmol/L (ref 135–145)
Total Bilirubin: 1.6 mg/dL — ABNORMAL HIGH (ref 0.3–1.2)
Total Protein: 9.4 g/dL — ABNORMAL HIGH (ref 6.5–8.1)

## 2016-09-11 ENCOUNTER — Inpatient Hospital Stay
Admission: EM | Admit: 2016-09-11 | Discharge: 2016-09-15 | DRG: 871 | Disposition: A | Discharge status: Skilled Nursing Facility | Payer: Medicaid Other | Attending: Internal Medicine | Admitting: Internal Medicine

## 2016-09-11 ENCOUNTER — Emergency Department: Payer: Medicaid Other

## 2016-09-11 ENCOUNTER — Encounter: Payer: Self-pay | Admitting: Emergency Medicine

## 2016-09-11 DIAGNOSIS — E870 Hyperosmolality and hypernatremia: Secondary | ICD-10-CM | POA: Diagnosis present

## 2016-09-11 DIAGNOSIS — E43 Unspecified severe protein-calorie malnutrition: Secondary | ICD-10-CM | POA: Diagnosis present

## 2016-09-11 DIAGNOSIS — A419 Sepsis, unspecified organism: Principal | ICD-10-CM | POA: Diagnosis present

## 2016-09-11 DIAGNOSIS — N179 Acute kidney failure, unspecified: Secondary | ICD-10-CM

## 2016-09-11 DIAGNOSIS — Z79899 Other long term (current) drug therapy: Secondary | ICD-10-CM

## 2016-09-11 DIAGNOSIS — Z23 Encounter for immunization: Secondary | ICD-10-CM

## 2016-09-11 DIAGNOSIS — R000 Tachycardia, unspecified: Secondary | ICD-10-CM | POA: Diagnosis present

## 2016-09-11 DIAGNOSIS — Z681 Body mass index (BMI) 19 or less, adult: Secondary | ICD-10-CM | POA: Diagnosis not present

## 2016-09-11 DIAGNOSIS — N170 Acute kidney failure with tubular necrosis: Secondary | ICD-10-CM | POA: Diagnosis present

## 2016-09-11 DIAGNOSIS — E860 Dehydration: Secondary | ICD-10-CM | POA: Diagnosis present

## 2016-09-11 DIAGNOSIS — Z931 Gastrostomy status: Secondary | ICD-10-CM | POA: Diagnosis not present

## 2016-09-11 DIAGNOSIS — R112 Nausea with vomiting, unspecified: Secondary | ICD-10-CM | POA: Diagnosis not present

## 2016-09-11 DIAGNOSIS — I69320 Aphasia following cerebral infarction: Secondary | ICD-10-CM

## 2016-09-11 DIAGNOSIS — N390 Urinary tract infection, site not specified: Secondary | ICD-10-CM | POA: Diagnosis present

## 2016-09-11 DIAGNOSIS — E872 Acidosis: Secondary | ICD-10-CM | POA: Diagnosis present

## 2016-09-11 DIAGNOSIS — R1111 Vomiting without nausea: Secondary | ICD-10-CM

## 2016-09-11 DIAGNOSIS — K922 Gastrointestinal hemorrhage, unspecified: Secondary | ICD-10-CM | POA: Diagnosis present

## 2016-09-11 DIAGNOSIS — E876 Hypokalemia: Secondary | ICD-10-CM | POA: Diagnosis present

## 2016-09-11 DIAGNOSIS — I69391 Dysphagia following cerebral infarction: Secondary | ICD-10-CM

## 2016-09-11 DIAGNOSIS — Z7401 Bed confinement status: Secondary | ICD-10-CM

## 2016-09-11 DIAGNOSIS — J189 Pneumonia, unspecified organism: Secondary | ICD-10-CM | POA: Diagnosis present

## 2016-09-11 DIAGNOSIS — Y95 Nosocomial condition: Secondary | ICD-10-CM | POA: Diagnosis present

## 2016-09-11 DIAGNOSIS — I10 Essential (primary) hypertension: Secondary | ICD-10-CM | POA: Diagnosis present

## 2016-09-11 DIAGNOSIS — Z7982 Long term (current) use of aspirin: Secondary | ICD-10-CM | POA: Diagnosis not present

## 2016-09-11 DIAGNOSIS — Z66 Do not resuscitate: Secondary | ICD-10-CM | POA: Diagnosis present

## 2016-09-11 DIAGNOSIS — J181 Lobar pneumonia, unspecified organism: Secondary | ICD-10-CM

## 2016-09-11 HISTORY — DX: Dysphagia, unspecified: R13.10

## 2016-09-11 HISTORY — DX: Essential (primary) hypertension: I10

## 2016-09-11 HISTORY — DX: Aphasia: R47.01

## 2016-09-11 HISTORY — DX: Cerebral infarction, unspecified: I63.9

## 2016-09-11 LAB — URINALYSIS, COMPLETE (UACMP) WITH MICROSCOPIC
BILIRUBIN URINE: NEGATIVE
GLUCOSE, UA: NEGATIVE mg/dL
KETONES UR: NEGATIVE mg/dL
Leukocytes, UA: NEGATIVE
Nitrite: NEGATIVE
Specific Gravity, Urine: 1.025 (ref 1.005–1.030)
pH: 5 (ref 5.0–8.0)

## 2016-09-11 LAB — COMPREHENSIVE METABOLIC PANEL
ALK PHOS: 75 U/L (ref 38–126)
ALT: 20 U/L (ref 17–63)
AST: 54 U/L — ABNORMAL HIGH (ref 15–41)
Albumin: 4.3 g/dL (ref 3.5–5.0)
Anion gap: 13 (ref 5–15)
BILIRUBIN TOTAL: 1.7 mg/dL — AB (ref 0.3–1.2)
BUN: 53 mg/dL — AB (ref 6–20)
CO2: 26 mmol/L (ref 22–32)
CREATININE: 2.35 mg/dL — AB (ref 0.61–1.24)
Calcium: 9.1 mg/dL (ref 8.9–10.3)
Chloride: 106 mmol/L (ref 101–111)
GFR calc Af Amer: 33 mL/min — ABNORMAL LOW (ref >60)
GFR calc non Af Amer: 28 mL/min — ABNORMAL LOW (ref >60)
Glucose, Bld: 157 mg/dL — ABNORMAL HIGH (ref 65–99)
Potassium: 3.1 mmol/L — ABNORMAL LOW (ref 3.5–5.1)
Sodium: 145 mmol/L (ref 135–145)
TOTAL PROTEIN: 9.1 g/dL — AB (ref 6.5–8.1)

## 2016-09-11 LAB — CBC
HCT: 53.1 % — ABNORMAL HIGH (ref 40.0–52.0)
Hemoglobin: 18.4 g/dL — ABNORMAL HIGH (ref 13.0–18.0)
MCH: 34.2 pg — ABNORMAL HIGH (ref 26.0–34.0)
MCHC: 34.7 g/dL (ref 32.0–36.0)
MCV: 98.7 fL (ref 80.0–100.0)
PLATELETS: 239 10*3/uL (ref 150–440)
RBC: 5.39 MIL/uL (ref 4.40–5.90)
RDW: 13.2 % (ref 11.5–14.5)
WBC: 18.4 10*3/uL — AB (ref 3.8–10.6)

## 2016-09-11 LAB — LACTIC ACID, PLASMA
Lactic Acid, Venous: 4.3 mmol/L (ref 0.5–1.9)
Lactic Acid, Venous: 2.7 mmol/L (ref 0.5–1.9)

## 2016-09-11 LAB — PROTIME-INR
INR: 1.24
Prothrombin Time: 15.7 seconds — ABNORMAL HIGH (ref 11.4–15.2)

## 2016-09-11 LAB — HEMOGLOBIN: Hemoglobin: 17.5 g/dL (ref 13.0–18.0)

## 2016-09-11 LAB — CREATININE, SERUM
Creatinine, Ser: 2.03 mg/dL — ABNORMAL HIGH (ref 0.61–1.24)
GFR calc Af Amer: 39 mL/min — ABNORMAL LOW (ref >60)
GFR calc non Af Amer: 34 mL/min — ABNORMAL LOW (ref >60)

## 2016-09-11 LAB — MRSA PCR SCREENING: MRSA by PCR: NEGATIVE

## 2016-09-11 LAB — TYPE AND SCREEN
ABO/RH(D): A NEG
ANTIBODY SCREEN: NEGATIVE

## 2016-09-11 LAB — APTT: aPTT: 32 seconds (ref 24–36)

## 2016-09-11 LAB — PROCALCITONIN: PROCALCITONIN: 0.37 ng/mL

## 2016-09-11 LAB — MAGNESIUM: Magnesium: 2.3 mg/dL (ref 1.7–2.4)

## 2016-09-11 MED ORDER — ONDANSETRON HCL 4 MG/2ML IJ SOLN
4.0000 mg | Freq: Once | INTRAMUSCULAR | Status: AC
  Administered 2016-09-11: 4 mg via INTRAVENOUS

## 2016-09-11 MED ORDER — ACETAMINOPHEN 650 MG RE SUPP: 650.0000 mg | Freq: Four times a day (QID) | RECTAL | Status: DC | PRN

## 2016-09-11 MED ORDER — METOPROLOL TARTRATE 50 MG PO TABS
100.0000 mg | ORAL_TABLET | Freq: Two times a day (BID) | ORAL | Status: DC
  Administered 2016-09-11 – 2016-09-15 (×8): 100 mg via ORAL
  Filled 2016-09-11 (×9): qty 2

## 2016-09-11 MED ORDER — POTASSIUM CHLORIDE CRYS ER 20 MEQ PO TBCR: 40.0000 meq | EXTENDED_RELEASE_TABLET | Freq: Once | ORAL | Status: DC

## 2016-09-11 MED ORDER — PANTOPRAZOLE SODIUM 40 MG PO TBEC: 40.0000 mg | DELAYED_RELEASE_TABLET | Freq: Every day | ORAL | Status: DC

## 2016-09-11 MED ORDER — TWOCAL HN PO LIQD: 237.0000 mL | Freq: Four times a day (QID) | ORAL | Status: DC

## 2016-09-11 MED ORDER — AZITHROMYCIN 500 MG IV SOLR
500.0000 mg | Freq: Once | INTRAVENOUS | Status: AC
  Administered 2016-09-11: 500 mg via INTRAVENOUS
  Filled 2016-09-11: qty 500

## 2016-09-11 MED ORDER — ORAL CARE MOUTH RINSE
15.0000 mL | Freq: Two times a day (BID) | OROMUCOSAL | Status: DC
  Administered 2016-09-12 – 2016-09-14 (×6): 15 mL via OROMUCOSAL

## 2016-09-11 MED ORDER — SODIUM CHLORIDE 0.9% FLUSH
3.0000 mL | Freq: Two times a day (BID) | INTRAVENOUS | Status: DC
  Administered 2016-09-11 – 2016-09-13 (×4): 3 mL via INTRAVENOUS

## 2016-09-11 MED ORDER — ONDANSETRON HCL 4 MG PO TABS: 4.0000 mg | ORAL_TABLET | Freq: Four times a day (QID) | ORAL | Status: DC | PRN

## 2016-09-11 MED ORDER — POTASSIUM CHLORIDE 20 MEQ/15ML (10%) PO SOLN
40.0000 meq | Freq: Once | ORAL | Status: AC
  Administered 2016-09-11: 40 meq via ORAL
  Filled 2016-09-11: qty 30

## 2016-09-11 MED ORDER — LEVALBUTEROL HCL 1.25 MG/0.5ML IN NEBU
1.2500 mg | INHALATION_SOLUTION | Freq: Four times a day (QID) | RESPIRATORY_TRACT | Status: DC
  Filled 2016-09-11: qty 0.5

## 2016-09-11 MED ORDER — VANCOMYCIN HCL IN DEXTROSE 1-5 GM/200ML-% IV SOLN
1000.0000 mg | Freq: Once | INTRAVENOUS | Status: AC
  Administered 2016-09-11: 1000 mg via INTRAVENOUS
  Filled 2016-09-11: qty 200

## 2016-09-11 MED ORDER — SODIUM CHLORIDE 0.9 % IV BOLUS (SEPSIS)
1000.0000 mL | Freq: Once | INTRAVENOUS | Status: AC
  Administered 2016-09-11: 1000 mL via INTRAVENOUS

## 2016-09-11 MED ORDER — VANCOMYCIN HCL IN DEXTROSE 750-5 MG/150ML-% IV SOLN
750.0000 mg | INTRAVENOUS | Status: DC
  Filled 2016-09-11: qty 150

## 2016-09-11 MED ORDER — ORAL CARE MOUTH RINSE: 15.0000 mL | Freq: Two times a day (BID) | OROMUCOSAL | Status: DC

## 2016-09-11 MED ORDER — CEFTRIAXONE SODIUM 1 G IJ SOLR: 1.0000 g | Freq: Once | INTRAMUSCULAR | Status: DC

## 2016-09-11 MED ORDER — LEVALBUTEROL HCL 1.25 MG/0.5ML IN NEBU
1.2500 mg | INHALATION_SOLUTION | Freq: Four times a day (QID) | RESPIRATORY_TRACT | Status: DC
  Administered 2016-09-12 – 2016-09-13 (×6): 1.25 mg via RESPIRATORY_TRACT
  Filled 2016-09-11 (×7): qty 0.5

## 2016-09-11 MED ORDER — ONDANSETRON HCL 4 MG/2ML IJ SOLN
INTRAMUSCULAR | Status: AC
  Administered 2016-09-11: 4 mg via INTRAVENOUS
  Filled 2016-09-11: qty 2

## 2016-09-11 MED ORDER — SENNOSIDES-DOCUSATE SODIUM 8.6-50 MG PO TABS: 1.0000 | ORAL_TABLET | Freq: Every evening | ORAL | Status: DC | PRN

## 2016-09-11 MED ORDER — HYDROCODONE-ACETAMINOPHEN 5-325 MG PO TABS
1.0000 | ORAL_TABLET | ORAL | Status: DC | PRN
  Administered 2016-09-14: 1 via ORAL
  Filled 2016-09-11: qty 1

## 2016-09-11 MED ORDER — PNEUMOCOCCAL VAC POLYVALENT 25 MCG/0.5ML IJ INJ
0.5000 mL | INJECTION | INTRAMUSCULAR | Status: AC
Start: 2016-09-12 — End: 2016-09-12
  Administered 2016-09-12: 0.5 mL via INTRAMUSCULAR
  Filled 2016-09-11: qty 0.5

## 2016-09-11 MED ORDER — ACETAMINOPHEN 325 MG PO TABS: 650.0000 mg | ORAL_TABLET | Freq: Four times a day (QID) | ORAL | Status: DC | PRN

## 2016-09-11 MED ORDER — BISACODYL 5 MG PO TBEC: 5.0000 mg | DELAYED_RELEASE_TABLET | Freq: Every day | ORAL | Status: DC | PRN

## 2016-09-11 MED ORDER — POTASSIUM CHLORIDE IN NACL 20-0.9 MEQ/L-% IV SOLN
INTRAVENOUS | Status: DC
  Administered 2016-09-11 – 2016-09-12 (×2): via INTRAVENOUS
  Filled 2016-09-11 (×4): qty 1000

## 2016-09-11 MED ORDER — DIVALPROEX SODIUM 125 MG PO CSDR
125.0000 mg | DELAYED_RELEASE_CAPSULE | Freq: Every day | ORAL | Status: DC
  Administered 2016-09-11 – 2016-09-15 (×5): 125 mg via ORAL
  Filled 2016-09-11 (×6): qty 1

## 2016-09-11 MED ORDER — CHLORHEXIDINE GLUCONATE 0.12 % MT SOLN
15.0000 mL | Freq: Two times a day (BID) | OROMUCOSAL | Status: DC
  Administered 2016-09-12 – 2016-09-15 (×8): 15 mL via OROMUCOSAL
  Filled 2016-09-11 (×7): qty 15

## 2016-09-11 MED ORDER — GUAIFENESIN 100 MG/5ML PO SOLN
5.0000 mL | ORAL | Status: DC | PRN
  Administered 2016-09-15: 100 mg via ORAL
  Filled 2016-09-11 (×2): qty 5

## 2016-09-11 MED ORDER — INFLUENZA VAC SPLIT QUAD 0.5 ML IM SUSY
0.5000 mL | PREFILLED_SYRINGE | INTRAMUSCULAR | Status: AC
  Administered 2016-09-12: 0.5 mL via INTRAMUSCULAR
  Filled 2016-09-11: qty 0.5

## 2016-09-11 MED ORDER — ONDANSETRON HCL 4 MG/2ML IJ SOLN
4.0000 mg | Freq: Four times a day (QID) | INTRAMUSCULAR | Status: DC | PRN
  Administered 2016-09-12: 4 mg via INTRAVENOUS
  Filled 2016-09-11: qty 2

## 2016-09-11 MED ORDER — DEXTROSE 5 % IV SOLN
2.0000 g | INTRAVENOUS | Status: DC
  Administered 2016-09-11: 2 g via INTRAVENOUS
  Filled 2016-09-11: qty 2

## 2016-09-11 MED ORDER — ALBUTEROL SULFATE (2.5 MG/3ML) 0.083% IN NEBU: 2.5000 mg | INHALATION_SOLUTION | RESPIRATORY_TRACT | Status: DC | PRN

## 2016-09-11 MED ORDER — PANTOPRAZOLE SODIUM 40 MG IV SOLR
40.0000 mg | Freq: Two times a day (BID) | INTRAVENOUS | Status: DC
  Administered 2016-09-11 – 2016-09-15 (×7): 40 mg via INTRAVENOUS
  Filled 2016-09-11 (×9): qty 40

## 2016-09-11 MED ORDER — CEFTRIAXONE SODIUM-DEXTROSE 1-3.74 GM-% IV SOLR
1.0000 g | Freq: Once | INTRAVENOUS | Status: AC
  Administered 2016-09-11: 1 g via INTRAVENOUS
  Filled 2016-09-11: qty 50

## 2016-09-11 NOTE — Progress Notes (Signed)
Pharmacy Antibiotic Note  Willie Waller is a 61 y.o. male admitted on 09/11/2016 with  pneumonia and sepsis.  Pharmacy has been consulted for Cefepime and Vancomycin dosing. Patient received Ceftriaxone and Azithromycin IV x 1 dose in ED.   Plan: Ke: 0.0024   T1/2: 29   Vd: 36  Will give patient vancomycin 1gm IV once, followed by Vancomycin 750mg  IV every 36 hours  (20 hour stack dosing). Calculated trough at Css 17. Will continue to monitor renal function and adjust dose as needed. MRSA PCR ordered - recommend D/C of vancomycin in MRSA PCR is negative.   Will start patient on cefepime 2gm IV every 24 hours based on current CrCl.    Height: 5\' 8"  (172.7 cm) Weight: 112 lb 1.6 oz (50.8 kg) IBW/kg (Calculated) : 68.4  Temp (24hrs), Avg:97 F (36.1 C), Min:97 F (36.1 C), Max:97 F (36.1 C)   Recent Labs Lab 09/10/16 1150 09/11/16 1044 09/11/16 1512  WBC 13.9* 18.4*  --   CREATININE 2.04* 2.35*  --   LATICACIDVEN  --   --  4.3*    Estimated Creatinine Clearance: 24 mL/min (by C-G formula based on SCr of 2.35 mg/dL (H)).    No Known Allergies  Antimicrobials this admission: 1/15 ceftriaxone and Azithromycin >> X1 dose 1/15 cefepime >>  1/15 Vancomycin >>  Dose adjustments this admission:  Microbiology results: 1/15  BCx: sent 1/15 UCx: sent 1/15 MRSA PCR: pending  Thank you for allowing pharmacy to be a part of this patient's care.  Gardner CandleSheema M Larae Caison, PharmD, BCPS Clinical Pharmacist 09/11/2016 5:51 PM

## 2016-09-11 NOTE — Progress Notes (Signed)
Dr. Imogene Burnhen made aware of patient's sustained HR 130-140s; acknowledged; continue to monitor. Windy Carinaurner,Phila Shoaf K, RN 8:45 PM,09/11/2016

## 2016-09-11 NOTE — ED Provider Notes (Signed)
[facility] Emergency Department Provider Note ____________________________________________   I have reviewed the triage vital signs and the triage nursing note.  HISTORY  Chief Complaint GI Bleeding   Historian Limited history from patient as he is essentially nonverbal after a stroke History from nursing home report  HPI Willie Waller is a 61 y.o. 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 Patient shakes his head yes to pain and points to his epigastrium. He shakes his head yes to nausea and vomiting. Shakes his head no diarrhea. She says had no chest pain or trouble breathing. She said yesterday cough. He does have a cough here. Shakes head no to fever.    Past Medical History:  Diagnosis Date  . Aphasia   . Dysphagia   . Hypertension   . Stroke [facility])     There are no active problems to display for this patient.   Past Surgical History:  Procedure Laterality Date  . PEG PLACEMENT      Prior to Admission medications   Medication Sig Start Date End Date Taking? Authorizing Provider  acetaminophen (TYLENOL) 325 MG tablet Take 650 mg by mouth every 6 (six) hours as needed.   Yes Historical Provider, MD  amoxicillin-clavulanate (AUGMENTIN) 500-125 MG tablet Take 1 tablet by mouth 2 (two) times daily. Suspected cholecystitis   Yes Historical Provider, MD  aspirin 81 MG chewable tablet Chew by mouth daily.   Yes Historical Provider, MD  calcium carbonate (OSCAL) 1500 (600 Ca) MG TABS tablet Take by mouth 2 (two) times daily with a meal.   Yes Historical Provider, MD  divalproex (DEPAKOTE SPRINKLE) 125 MG capsule Take 125 mg by mouth daily.   Yes Historical Provider, MD  ketoconazole (NIZORAL) 2 % cream Apply 1 application topically 2 (two) times daily as needed for irritation.   Yes Historical Provider, MD  metoprolol (LOPRESSOR) 100 MG tablet Take 100 mg by  mouth 2 (two) times daily.   Yes Historical Provider, MD  Nutritional Supplements (TWOCAL HN) LIQD Take 237 mLs by mouth 4 (four) times daily.   Yes Historical Provider, MD  ondansetron (ZOFRAN) 4 MG tablet Take 4 mg by mouth every 6 (six) hours as needed for nausea or vomiting.   Yes Historical Provider, MD  pantoprazole (PROTONIX) 40 MG tablet Take 40 mg by mouth daily.   Yes Historical Provider, MD    No Known Allergies  No family history on file.  Social History Social History  Substance Use Topics  . Smoking status: Unknown If Ever Smoked  . Smokeless tobacco: Not on file  . Alcohol use No    Review of Systems  Constitutional: Negative for fever. Eyes: Negative for Red eyes ENT: Negative for sore throat. Cardiovascular: Negative for chest pain. Respiratory: Negative for shortness of breath.  Positive for cough Gastrointestinal: As per history of present illness, onset early this morning. Genitourinary: Negative for dysuria. Musculoskeletal: Negative for back pain. Skin: Negative for rash. Neurological: Negative for headache. 10 point Review of Systems otherwise negative ____________________________________________   PHYSICAL EXAM:  VITAL SIGNS: ED Triage Vitals [09/11/16 1031]  Enc Vitals Group     BP (!) 145/106     Pulse Rate (!) 133     Resp (!) 22     Temp 97 F (36.1 C)     Temp Source Oral     SpO2 93 %     Weight  112 lb 1.6 oz (50.8 kg)     Height 5\' 8"  (1.727 m)     Head Circumference      Peak Flow      Pain Score      Pain Loc      Pain Edu?      Excl. in GC?      Constitutional: Alert and Answers questions seemingly appropriately with yes and no. Well appearing and in no distress. HEENT   Head: Normocephalic and atraumatic.      Eyes: Conjunctivae are normal. PERRL. Normal extraocular movements.      Ears:         Nose: No congestion/rhinnorhea.   Mouth/Throat: Mucous membranes are moderately dry.   Neck: No  stridor. Cardiovascular/Chest: Tachycardic, regular rhythm.  No murmurs, rubs, or gallops. Respiratory: Normal respiratory effort without tachypnea nor retractions. Severe rhonchi in all fields. Slightly tight breath sounds without obvious wheezing. Gastrointestinal: Soft. No distention, no guarding, no rebound. Mild tenderness in the epigastrium.  Genitourinary/rectal:Deferred Musculoskeletal: Nontender with normal range of motion in all extremities. No joint effusions.  No lower extremity tenderness.  No edema. Neurologic: Aphasic with slight facial droop on the right. Contractures of the lower extremities. Skin:  Skin is warm, dry and intact. No rash noted.   ____________________________________________  LABS (pertinent positives/negatives)  Labs Reviewed  COMPREHENSIVE METABOLIC PANEL - Abnormal; Notable for the following:       Result Value   Potassium 3.1 (*)    Glucose, Bld 157 (*)    BUN 53 (*)    Creatinine, Ser 2.35 (*)    Total Protein 9.1 (*)    AST 54 (*)    Total Bilirubin 1.7 (*)    GFR calc non Af Amer 28 (*)    GFR calc Af Amer 33 (*)    All other components within normal limits  CBC - Abnormal; Notable for the following:    WBC 18.4 (*)    Hemoglobin 18.4 (*)    HCT 53.1 (*)    MCH 34.2 (*)    All other components within normal limits  URINALYSIS, COMPLETE (UACMP) WITH MICROSCOPIC - Abnormal; Notable for the following:    Color, Urine YELLOW (*)    APPearance HAZY (*)    Hgb urine dipstick SMALL (*)    Protein, ur >=300 (*)    Bacteria, UA RARE (*)    Squamous Epithelial / LPF 0-5 (*)    All other components within normal limits  CULTURE, BLOOD (ROUTINE X 2)  CULTURE, BLOOD (ROUTINE X 2)  LACTIC ACID, PLASMA  LACTIC ACID, PLASMA  POC OCCULT BLOOD, ED  TYPE AND SCREEN    ____________________________________________    EKG I, Governor Rooksebecca Jolonda Gomm, MD, the attending physician have personally viewed and interpreted all ECGs.  130 bpm. Sinus tachycardia.  Nonspecific interventricular conduction delay, incomplete right bundle-branch block. ST segment depression inferiorly and laterally. No ST segment elevation.  Prior EKG from 2013 shows that the ST segment depressions appear to be new. ____________________________________________  RADIOLOGY All Xrays were viewed by me. Imaging interpreted by Radiologist.  Chest x-ray portable:   IMPRESSION: Right lower lobe atelectasis/ pneumonia has developed since the prior study. Elevated right hemidiaphragm due to volume loss. __________________________________________  PROCEDURES  Procedure(s) performed: None  Critical Care performed: None  ____________________________________________   ED COURSE / ASSESSMENT AND PLAN  Pertinent labs & imaging results that were available during my care of the patient were reviewed by me and considered in  my medical decision making (see chart for details).   Willie Waller was sent in for coffee-ground emesis, and found to be tachycardic without hypertension. More concerning looks a little dehydrated to me and he has a lot of rhonchorous breath sounds. He also tight in the airways, but I don't give him a DuoNeb at this point time and I don't think it's the initiating cause, and he started very tachycardic.  Rectal exam negative for blood. Brownish stool which is heme negative. I did suggest emesis, I looked a little dark, but not obviously bloody.  Labs show increased pain and creatinine, acute renal failure. His hemoglobin is 18, no priors, but this is likely partially hemoconcentrated, but in any case does not appear to be anemic. He does not appear to be having active frank blood.  Because of the elevated white blood cell count, and the rhonchi, chest x-ray was added, urinalysis was added.   Chest x-ray concerning for right lower lobe pneumonia. Place patient on sepsis pathway. Lactate added. Rocephin and azithromycin were ordered.  Urinalysis also was  consistent with urinary tract infection, urine culture sent.  Spoke with hospitalist for admission. Lactate pending. No hypotension.    CONSULTATIONS:  Hospitalist for admission.   Patient / Family / Caregiver informed of clinical course, medical decision-making process, and agree with plan.     ___________________________________________   FINAL CLINICAL IMPRESSION(S) / ED DIAGNOSES   Final diagnoses:  Hypokalemia  Acute renal failure, unspecified acute renal failure type (HCC)  Non-intractable vomiting without nausea, unspecified vomiting type  Pneumonia of right lower lobe due to infectious organism [facility])  Urinary tract infection without hematuria, site unspecified              Note: This dictation was prepared with Dragon dictation. Any transcriptional errors that result from this process are unintentional    Governor Rooks, MD 09/11/16 1432

## 2016-09-11 NOTE — H&P (Addendum)
[facility] - [place] at [facility]   PATIENT NAME: Willie Waller    MR#:  [id number]  DATE OF BIRTH:  24-Jan-1956  DATE OF ADMISSION:  09/11/2016  PRIMARY CARE PHYSICIAN: No PCP Per Patient   REQUESTING/REFERRING PHYSICIAN: Governor Rooksebecca Lord, MD  CHIEF COMPLAINT:   Chief Complaint  Patient presents with  . GI Bleeding   Coffee-ground vomiting HISTORY OF PRESENT ILLNESS:  Willie Waller  is a 61 y.o. male with a known history of CVA with aphasia and dysphagia and hypertension. The patient was sent from nursing home to the ED due to multiple episodes of coffee-ground vomiting. He is nonverbal and noncommunicative. He is found tachycardia and leukocytosis. Chest x-ray show right lower lobe pneumonia.  PAST MEDICAL HISTORY:   Past Medical History:  Diagnosis Date  . Aphasia   . Dysphagia   . Hypertension   . Stroke [facility](HCC)     PAST SURGICAL HISTORY:   Past Surgical History:  Procedure Laterality Date  . PEG PLACEMENT      SOCIAL HISTORY:   Social History  Substance Use Topics  . Smoking status: Unknown If Ever Smoked  . Smokeless tobacco: Not on file  . Alcohol use No    FAMILY HISTORY:  No family history on file. Unable to obtain.  DRUG ALLERGIES:  No Known Allergies  REVIEW OF SYSTEMS:   Review of Systems  Unable to perform ROS: Patient nonverbal    MEDICATIONS AT HOME:   Prior to Admission medications   Medication Sig Start Date End Date Taking? Authorizing Provider  acetaminophen (TYLENOL) 325 MG tablet Take 650 mg by mouth every 6 (six) hours as needed.   Yes Historical Provider, MD  amoxicillin-clavulanate (AUGMENTIN) 500-125 MG tablet Take 1 tablet by mouth 2 (two) times daily. Suspected cholecystitis   Yes Historical Provider, MD  aspirin 81 MG chewable tablet Chew by mouth daily.   Yes Historical Provider, MD  calcium carbonate (OSCAL) 1500 (600 Ca) MG TABS tablet Take by mouth 2 (two) times daily with a meal.   Yes Historical  Provider, MD  divalproex (DEPAKOTE SPRINKLE) 125 MG capsule Take 125 mg by mouth daily.   Yes Historical Provider, MD  ketoconazole (NIZORAL) 2 % cream Apply 1 application topically 2 (two) times daily as needed for irritation.   Yes Historical Provider, MD  metoprolol (LOPRESSOR) 100 MG tablet Take 100 mg by mouth 2 (two) times daily.   Yes Historical Provider, MD  Nutritional Supplements (TWOCAL HN) LIQD Take 237 mLs by mouth 4 (four) times daily.   Yes Historical Provider, MD  ondansetron (ZOFRAN) 4 MG tablet Take 4 mg by mouth every 6 (six) hours as needed for nausea or vomiting.   Yes Historical Provider, MD  pantoprazole (PROTONIX) 40 MG tablet Take 40 mg by mouth daily.   Yes Historical Provider, MD      VITAL SIGNS:  Blood pressure (!) 134/98, pulse (!) 108, temperature 97 F (36.1 C), temperature source Oral, resp. rate 19, height 5\' 8"  (1.727 m), weight 112 lb 1.6 oz (50.8 kg), SpO2 98 %.  PHYSICAL EXAMINATION:  Physical Exam  GENERAL:  61 y.o.-year-old patient lying in the bed with no acute distress. Malnutrition. EYES: Pupils equal, round, reactive to light and accommodation. No scleral icterus. Extraocular muscles intact.  HEENT: Head atraumatic, normocephalic. Dry oral mucosa.  NECK:  Supple, no jugular venous distention. No thyroid enlargement, no tenderness.  LUNGS: Normal breath sounds bilaterally, crackles on right side. No use of  accessory muscles of respiration.  CARDIOVASCULAR: S1, S2 normal. No murmurs, rubs, or gallops.  ABDOMEN: Soft, nontender, nondistended. Bowel sounds present. No organomegaly or mass. PEG tube in situ. EXTREMITIES: No pedal edema, cyanosis, or clubbing.  NEUROLOGIC: Unable to exam. PSYCHIATRIC: The patient is nonverbal. SKIN: No obvious rash, lesion, or ulcer.   LABORATORY PANEL:   CBC  Recent Labs Lab 09/11/16 1044  WBC 18.4*  HGB 18.4*  HCT 53.1*  PLT 239    ------------------------------------------------------------------------------------------------------------------  Chemistries   Recent Labs Lab 09/11/16 1044  NA 145  K 3.1*  CL 106  CO2 26  GLUCOSE 157*  BUN 53*  CREATININE 2.35*  CALCIUM 9.1  AST 54*  ALT 20  ALKPHOS 75  BILITOT 1.7*   ------------------------------------------------------------------------------------------------------------------  Cardiac Enzymes No results for input(s): TROPONINI in the last 168 hours. ------------------------------------------------------------------------------------------------------------------  RADIOLOGY:  Dg Chest Port 1 View  Result Date: 09/11/2016 CLINICAL DATA:  Productive cough EXAM: PORTABLE CHEST 1 VIEW COMPARISON:  03/20/2012 FINDINGS: Right lower lobe airspace disease has developed since the prior study. There is volume loss with elevated right hemidiaphragm. This may represent atelectasis or pneumonia. Left lung clear. Negative for heart failure. IMPRESSION: Right lower lobe atelectasis/ pneumonia has developed since the prior study. Elevated right hemidiaphragm due to volume loss. Electronically Signed   By: Marlan Palau M.D.   On: 09/11/2016 13:40      IMPRESSION AND PLAN:   Sepsis due to pneumonia, HAP and UTI. The patient will be admitted to medical floor. Start cefepime and vancomycin, follow-up cultures and CBC.  Acute renal failure due to dehydration/ATN. Start Normal saline IV and follow-up BMP.  Lactic acidosis. Continue treatment as above and we will fluid support, follow-up lactic acid.  Hypokalemia. Give potassium supplements and follow-up BMP.  Coffee-ground vomiting, questionable GI bleeding. Hold aspirin and follow-up hemoglobin, IV Protonix twice a day. GI consult prn.  Elevated bilirubin. Possible due to vomiting. Follow-up bilirubin. History of CVA. Hold aspirin due to questionable GI bleeding.Marland Kitchen Dysphagia and aphasia. Aspiration  precaution, continue PEG tube feeding, dietitian consult.  Malnutrition. Dietitian consult  All the records are reviewed and case discussed with ED provider. Management plans discussed with the patient, family and they are in agreement.  CODE STATUS: DO NOT RESUSCITATE  TOTAL TIME TAKING CARE OF THIS PATIENT: 55 minutes.    Shaune Pollack M.D on 09/11/2016 at 2:59 PM  Between 7am to 6pm - Pager - [phone]  After 6pm go to [email or web address] - Scientist, research (life sciences) [facility]  Office  [phone]  CC: Primary care physician; No PCP Per Patient   Note: This dictation was prepared with Dragon dictation along with smaller phrase technology. Any transcriptional errors that result from this process are unintentional.

## 2016-09-11 NOTE — ED Notes (Signed)
MD Imogene Burnhen made aware of lactic acid 4.3

## 2016-09-11 NOTE — ED Triage Notes (Signed)
Ar[facility] via [facility] [place].  Staff report patient had several episodes of vomiting coffee ground emesis this morning started at around 0630.  EMS also report c/o RUQ pain.   Patient has history of CVA and is non verbal at baseline.  Patient able to communicate by shaking head yes/ no in response.

## 2016-09-11 NOTE — ED Notes (Signed)
Assisted nurse with in and out cath. 

## 2016-09-12 ENCOUNTER — Encounter: Payer: Self-pay | Admitting: *Deleted

## 2016-09-12 DIAGNOSIS — E43 Unspecified severe protein-calorie malnutrition: Secondary | ICD-10-CM | POA: Insufficient documentation

## 2016-09-12 LAB — CBC
HCT: 46.5 % (ref 40.0–52.0)
Hemoglobin: 15.8 g/dL (ref 13.0–18.0)
MCH: 34 pg (ref 26.0–34.0)
MCHC: 34 g/dL (ref 32.0–36.0)
MCV: 99.9 fL (ref 80.0–100.0)
PLATELETS: 186 10*3/uL (ref 150–440)
RBC: 4.65 MIL/uL (ref 4.40–5.90)
RDW: 13.1 % (ref 11.5–14.5)
WBC: 17.9 10*3/uL — AB (ref 3.8–10.6)

## 2016-09-12 LAB — BASIC METABOLIC PANEL
Anion gap: 6 (ref 5–15)
BUN: 44 mg/dL — ABNORMAL HIGH (ref 6–20)
CALCIUM: 8.6 mg/dL — AB (ref 8.9–10.3)
CO2: 27 mmol/L (ref 22–32)
CREATININE: 1.76 mg/dL — AB (ref 0.61–1.24)
Chloride: 114 mmol/L — ABNORMAL HIGH (ref 101–111)
GFR calc non Af Amer: 40 mL/min — ABNORMAL LOW (ref >60)
GFR, EST AFRICAN AMERICAN: 47 mL/min — AB (ref >60)
Glucose, Bld: 118 mg/dL — ABNORMAL HIGH (ref 65–99)
Potassium: 4.7 mmol/L (ref 3.5–5.1)
Sodium: 147 mmol/L — ABNORMAL HIGH (ref 135–145)

## 2016-09-12 LAB — BILIRUBIN, TOTAL: BILIRUBIN TOTAL: 1.7 mg/dL — AB (ref 0.3–1.2)

## 2016-09-12 MED ORDER — PRO-STAT SUGAR FREE PO LIQD
30.0000 mL | Freq: Every day | ORAL | Status: DC
  Administered 2016-09-12 – 2016-09-15 (×4): 30 mL

## 2016-09-12 MED ORDER — CEFEPIME HCL 2 G IJ SOLR
2.0000 g | Freq: Two times a day (BID) | INTRAMUSCULAR | Status: DC
  Administered 2016-09-12 – 2016-09-14 (×4): 2 g via INTRAVENOUS
  Filled 2016-09-12 (×7): qty 2

## 2016-09-12 MED ORDER — OSMOLITE 1.5 CAL PO LIQD
237.0000 mL | Freq: Every day | ORAL | Status: DC
  Administered 2016-09-12 – 2016-09-15 (×12): 237 mL

## 2016-09-12 MED ORDER — DEXTROSE 5 % IV SOLN
INTRAVENOUS | Status: AC
  Administered 2016-09-12 (×2): via INTRAVENOUS

## 2016-09-12 NOTE — NC FL2 (Signed)
[place] MEDICAID FL2 LEVEL OF CARE SCREENING TOOL     IDENTIFICATION  Patient Name: Willie Waller Birthdate: 1956-07-21 Sex: male Admission Date (Current Location): 09/11/2016  [place] and [place] Number:  Chiropodist and Address:  [facility], [address], [place], [place] [address]      Provider Number: [id number]  Attending Physician Name and Address:  Houston Siren, MD  Relative Name and Phone Number:       Current Level of Care: Hospital Recommended Level of Care: Skilled Nursing Facility Prior Approval Number:    Date Approved/Denied:   PASRR Number: [id number]  Discharge Plan: SNF    Current Diagnoses: Patient Active Problem List   Diagnosis Date Noted  . Sepsis (HCC) 09/11/2016    Orientation RESPIRATION BLADDER Height & Weight     Self  Normal Incontinent Weight: 112 lb 1.6 oz (50.8 kg) Height:  5\' 8"  (172.7 cm)  BEHAVIORAL SYMPTOMS/MOOD NEUROLOGICAL BOWEL NUTRITION STATUS   (none)  (none) Incontinent Feeding tube  AMBULATORY STATUS COMMUNICATION OF NEEDS Skin   Total Care Non-Verbally Normal                       Personal Care Assistance Level of Assistance  Total care       Total Care Assistance: Maximum assistance   Functional Limitations Info  Speech     Speech Info: Impaired    SPECIAL CARE FACTORS FREQUENCY                       Contractures Contractures Info: Not present    Additional Factors Info  Code Status, Allergies Code Status Info: dnr Allergies Info: nka           Current Medications (09/12/2016):  This is the current hospital active medication list Current Facility-Administered Medications  Medication Dose Route Frequency Provider Last Rate Last Dose  . 0.9 % NaCl with KCl 20 mEq/ L  infusion   Intravenous Continuous Shaune Pollack, MD 100 mL/hr at 09/12/16 [phone]    . acetaminophen (TYLENOL) tablet 650 mg  650 mg Oral Q6H PRN Shaune Pollack, MD       Or  . acetaminophen  (TYLENOL) suppository 650 mg  650 mg Rectal Q6H PRN Shaune Pollack, MD      . albuterol (PROVENTIL) (2.5 MG/3ML) 0.083% nebulizer solution 2.5 mg  2.5 mg Nebulization Q2H PRN Shaune Pollack, MD      . bisacodyl (DULCOLAX) EC tablet 5 mg  5 mg Oral Daily PRN Shaune Pollack, MD      . ceFEPIme (MAXIPIME) 2 g in dextrose 5 % 50 mL IVPB  2 g Intravenous Q12H Rolm Baptise, RPH   2 g at 09/12/16 1026  . chlorhexidine (PERIDEX) 0.12 % solution 15 mL  15 mL Mouth Rinse BID Shaune Pollack, MD   15 mL at 09/12/16 1025  . divalproex (DEPAKOTE SPRINKLE) capsule 125 mg  125 mg Oral Daily Shaune Pollack, MD   125 mg at 09/12/16 1034  . guaiFENesin (ROBITUSSIN) 100 MG/5ML solution 100 mg  5 mL Oral Q4H PRN Shaune Pollack, MD      . HYDROcodone-acetaminophen (NORCO/VICODIN) 5-325 MG per tablet 1-2 tablet  1-2 tablet Oral Q4H PRN Shaune Pollack, MD      . levalbuterol Pauline Aus) nebulizer solution 1.25 mg  1.25 mg Nebulization Q6H Shaune Pollack, MD   1.25 mg at 09/12/16 1021  . MEDLINE mouth rinse  15 mL Mouth  Rinse q12n4p Shaune PollackQing Chen, MD      . metoprolol (LOPRESSOR) tablet 100 mg  100 mg Oral BID Shaune PollackQing Chen, MD   100 mg at 09/12/16 1024  . ondansetron (ZOFRAN) tablet 4 mg  4 mg Oral Q6H PRN Shaune PollackQing Chen, MD       Or  . ondansetron [facility](ZOFRAN) injection 4 mg  4 mg Intravenous Q6H PRN Shaune PollackQing Chen, MD      . pantoprazole (PROTONIX) injection 40 mg  40 mg Intravenous Q12H Shaune PollackQing Chen, MD   40 mg at 09/12/16 1025  . senna-docusate (Senokot-S) tablet 1 tablet  1 tablet Oral QHS PRN Shaune PollackQing Chen, MD      . sodium chloride flush (NS) 0.9 % injection 3 mL  3 mL Intravenous Q12H Shaune PollackQing Chen, MD   3 mL at 09/12/16 1034     Discharge Medications: Please see discharge summary for a list of discharge medications.  Relevant Imaging Results:  Relevant Lab Results:   Additional Information    York SpanielMonica Stephine Langbehn, LCSW

## 2016-09-12 NOTE — Progress Notes (Signed)
Pharmacy Antibiotic Note  Willie Waller is a 61 y.o. male admitted on 09/11/2016 with  pneumonia and sepsis.  Pharmacy has been consulted for Cefepime and Vancomycin dosing. Patient received Ceftriaxone and Azithromycin IV x 1 dose in ED.  CXR shows possible RLL PNA  Plan: Vanc d/c based on negative MRSA PCR  Will transition patient to cefepime 2 g IV q12h based on improved renal function   Height: 5\' 8"  (172.7 cm) Weight: 112 lb 1.6 oz (50.8 kg) IBW/kg (Calculated) : 68.4  Temp (24hrs), Avg:98.6 F (37 C), Min:97.9 F (36.6 C), Max:99.1 F (37.3 C)   Recent Labs Lab 09/10/16 1150 09/11/16 1044 09/11/16 1512 09/11/16 1728 09/12/16 0212  WBC 13.9* 18.4*  --   --  17.9*  CREATININE 2.04* 2.35*  --  2.03* 1.76*  LATICACIDVEN  --   --  4.3* 2.7*  --     Estimated Creatinine Clearance: 32.1 mL/min (by C-G formula based on SCr of 1.76 mg/dL (H)).    No Known Allergies  Antimicrobials this admission: 1/15 ceftriaxone and Azithromycin >> X1 dose 1/15 cefepime >>  1/15 Vancomycin >> 1/16  Dose adjustments this admission: Cefepime q24 to q12 based on improved renal function  Microbiology results: 1/15  BCx: pending NG < 24 hrs 1/15 UCx: sent 1/15 MRSA PCR: negative  Thank you for allowing pharmacy to be a part of this patient's care.  Horris LatinoHolly Jeni Duling, PharmD Pharmacy Resident 09/12/2016 11:38 AM

## 2016-09-12 NOTE — Progress Notes (Signed)
[facility] - [place] at [facility]   PATIENT NAME: Willie Waller    MR#:  [id number]  DATE OF BIRTH:  07/05/1956  SUBJECTIVE:   Patient here due to sepsis secondary to pneumonia and also noted to have coffee-ground emesis. Hemoglobin stable. No vomiting overnight.  Pt. Non-verbal at baseline.    REVIEW OF SYSTEMS:    Review of Systems  Unable to perform ROS: Mental acuity    Nutrition: tube feeds Tolerating Diet: Yes Tolerating PT: Bedbound     DRUG ALLERGIES:  No Known Allergies  VITALS:  Blood pressure 134/73, pulse 83, temperature 98.7 F (37.1 C), temperature source Oral, resp. rate 18, height 5\' 8"  (1.727 m), weight 50.8 kg (112 lb 1.6 oz), SpO2 99 %.  PHYSICAL EXAMINATION:   Physical Exam  GENERAL:  61 y.o.-year-old patient lying in the bed in no acute distress.  EYES: Pupils equal, round, reactive to light and accommodation. No scleral icterus. Extraocular muscles intact.  HEENT: Head atraumatic, normocephalic. Oropharynx and nasopharynx clear.  NECK:  Supple, no jugular venous distention. No thyroid enlargement, no tenderness.  LUNGS: Normal breath sounds bilaterally, no wheezing, rales, rhonchi. No use of accessory muscles of respiration.  CARDIOVASCULAR: S1, S2 normal. No murmurs, rubs, or gallops.  ABDOMEN: Soft, nontender, nondistended. Bowel sounds present. No organomegaly or mass. + PEG tube in place.  EXTREMITIES: No cyanosis, clubbing or edema b/l.    NEUROLOGIC: Cranial nerves II through XII are intact. No focal Motor or sensory deficits b/l. Globally weak   PSYCHIATRIC: The patient is alert and oriented x 1.  SKIN: No obvious rash, lesion, or ulcer.    LABORATORY PANEL:   CBC  Recent Labs Lab 09/12/16 0212  WBC 17.9*  HGB 15.8  HCT 46.5  PLT 186   ------------------------------------------------------------------------------------------------------------------  Chemistries   Recent Labs Lab 09/11/16 1044  09/11/16 1728 09/12/16 0212  NA 145  --  147*  K 3.1*  --  4.7  CL 106  --  114*  CO2 26  --  27  GLUCOSE 157*  --  118*  BUN 53*  --  44*  CREATININE 2.35* 2.03* 1.76*  CALCIUM 9.1  --  8.6*  MG  --  2.3  --   AST 54*  --   --   ALT 20  --   --   ALKPHOS 75  --   --   BILITOT 1.7*  --  1.7*   ------------------------------------------------------------------------------------------------------------------  Cardiac Enzymes No results for input(s): TROPONINI in the last 168 hours. ------------------------------------------------------------------------------------------------------------------  RADIOLOGY:  Dg Chest Port 1 View  Result Date: 09/11/2016 CLINICAL DATA:  Productive cough EXAM: PORTABLE CHEST 1 VIEW COMPARISON:  03/20/2012 FINDINGS: Right lower lobe airspace disease has developed since the prior study. There is volume loss with elevated right hemidiaphragm. This may represent atelectasis or pneumonia. Left lung clear. Negative for heart failure. IMPRESSION: Right lower lobe atelectasis/ pneumonia has developed since the prior study. Elevated right hemidiaphragm due to volume loss. Electronically Signed   By: Marlan Palauharles  Clark M.D.   On: 09/11/2016 13:40     ASSESSMENT AND PLAN:   61 year old male with past medical history of CVA, chronic dysphagia/aphasia, hypertension who presented to the hospital due to sepsis.  1. Sepsis-secondary to pneumonia. UTI ruled out. -Improving, afebrile, hemodynamically stable. Cultures so far negative. Continue IV cefepime.  2. Pneumonia-cause of patient's sepsis. -MRSA PCR negative. Off vancomycin now. Continue IV cefepime. Follow cultures are negative so far.  3. Acute kidney  injury-secondary to sepsis. -Improving with IV fluids and follow BUN and creatinine.  4. Hypernatremia-secondary to dehydration. Will switch IV fluids to D5W follow sodium in a.m.  5. Essential hypertension-continue metoprolol.  6. GI bleed - patient  suspected to have GI bleed with coffee-ground emesis. His hemoglobin is also stable. He has had no other acute events overnight. Continue PPI twice a day. Follow hemoglobin.   All the records are reviewed and case discussed with Care Management/Social Worker. Management plans discussed with the patient, family and they are in agreement.  CODE STATUS: DNR  DVT Prophylaxis: TEd's & SCD's   TOTAL TIME TAKING CARE OF THIS PATIENT: 30 minutes.   POSSIBLE D/C IN 1-2 DAYS, DEPENDING ON CLINICAL CONDITION.   Houston Siren M.D on 09/12/2016 at 5:36 PM  Between 7am to 6pm - Pager - [phone]  After 6pm go to [email or web address] - Scientist, research (life sciences) [facility]  Office  [phone]  CC: Primary care physician; No PCP Per Patient

## 2016-09-12 NOTE — Progress Notes (Signed)
Initial Nutrition Assessment  DOCUMENTATION CODES:   Severe malnutrition in context of chronic illness, Underweight  INTERVENTION:  -Tube Feeding: TwoCal HN not on hospital formulary. Recommend substituting Osmolite 1.5  237 mL (1 can) 5 times per day with Prostat daily providing 1878 kcals, 90 g protein and 905 mL of free water.   NUTRITION DIAGNOSIS:   Malnutrition related to chronic illness as evidenced by severe depletion of muscle mass, severe depletion of body fat.  GOAL:   Patient will meet greater than or equal to 90% of their needs  MONITOR:   TF tolerance, Labs, Weight trends, I & O's, Skin  REASON FOR ASSESSMENT:   Consult Enteral/tube feeding initiation and management  ASSESSMENT:   61 yo male admitted with multiple episodes of cofffee-ground emesis, sepsis due to pneumonia and UTI, ARF due to dehydration/ATN. Pt with hx of CVA with aphasia and dysphagia s/p PEG tube placement, HTN. Pt is non-verbal but can shake head "yes" or "no" appropriately with questions at times per RN   Per chart review, pt takes bolus feedings of Two Cal HN 237 mL (1 can) QID providing 1900 kcals, 80 g, 664 mL of free water. [facility] and [facility], awaiting call back.  No weight history to compare weight trend in chart.   Nutrition-Focused physical exam completed. Findings are moderate to severe fat depletion, moderate to severe muscle depletion, and no edema.   Labs: sodium 147, Creatinine 1.76 Meds: D5 at 50 ml/hr  Diet Order:  Diet NPO time specified  Skin:  Reviewed, no issues  Last BM:  no documented BM  Height:   Ht Readings from Last 1 Encounters:  09/11/16 5\' 8"  (1.727 m)    Weight:   Wt Readings from Last 1 Encounters:  09/11/16 112 lb 1.6 oz (50.8 kg)   BMI:  Body mass index is 17.04 kg/m.  Estimated Nutritional Needs:   Kcal:  1530-1785 kcals  Protein:  75-90 g  Fluid:  >/= 1.5 L  EDUCATION NEEDS:   No education needs identified  at this time  Romelle StarcherCate Emran Molzahn MS, RD, LDN [phone] Pager  [phone] Weekend/On-Call Pager

## 2016-09-13 LAB — BASIC METABOLIC PANEL
Anion gap: 7 (ref 5–15)
BUN: 36 mg/dL — AB (ref 6–20)
CHLORIDE: 120 mmol/L — AB (ref 101–111)
CO2: 25 mmol/L (ref 22–32)
Calcium: 8.8 mg/dL — ABNORMAL LOW (ref 8.9–10.3)
Creatinine, Ser: 1.7 mg/dL — ABNORMAL HIGH (ref 0.61–1.24)
GFR calc Af Amer: 49 mL/min — ABNORMAL LOW (ref >60)
GFR calc non Af Amer: 42 mL/min — ABNORMAL LOW (ref >60)
GLUCOSE: 120 mg/dL — AB (ref 65–99)
POTASSIUM: 3.2 mmol/L — AB (ref 3.5–5.1)
Sodium: 152 mmol/L — ABNORMAL HIGH (ref 135–145)

## 2016-09-13 LAB — MAGNESIUM: Magnesium: 2.4 mg/dL (ref 1.7–2.4)

## 2016-09-13 LAB — URINE CULTURE: Culture: NO GROWTH

## 2016-09-13 LAB — CBC
HCT: 45.4 % (ref 40.0–52.0)
HEMOGLOBIN: 15.1 g/dL (ref 13.0–18.0)
MCH: 33.7 pg (ref 26.0–34.0)
MCHC: 33.4 g/dL (ref 32.0–36.0)
MCV: 100.8 fL — AB (ref 80.0–100.0)
Platelets: 173 10*3/uL (ref 150–440)
RBC: 4.5 MIL/uL (ref 4.40–5.90)
RDW: 13.8 % (ref 11.5–14.5)
WBC: 14.4 10*3/uL — ABNORMAL HIGH (ref 3.8–10.6)

## 2016-09-13 MED ORDER — POTASSIUM CHLORIDE 20 MEQ/15ML (10%) PO SOLN
40.0000 meq | Freq: Once | ORAL | Status: AC
  Administered 2016-09-13: 40 meq via ORAL
  Filled 2016-09-13: qty 30

## 2016-09-13 MED ORDER — DEXTROSE 5 % IV SOLN
INTRAVENOUS | Status: DC
  Administered 2016-09-13: 17:00:00 via INTRAVENOUS

## 2016-09-13 MED ORDER — PROCHLORPERAZINE EDISYLATE 5 MG/ML IJ SOLN
5.0000 mg | INTRAMUSCULAR | Status: DC | PRN
  Administered 2016-09-13: 5 mg via INTRAVENOUS
  Filled 2016-09-13: qty 2

## 2016-09-13 MED ORDER — LEVALBUTEROL HCL 1.25 MG/0.5ML IN NEBU
1.2500 mg | INHALATION_SOLUTION | Freq: Two times a day (BID) | RESPIRATORY_TRACT | Status: DC
  Administered 2016-09-13: 1.25 mg via RESPIRATORY_TRACT
  Filled 2016-09-13 (×2): qty 0.5

## 2016-09-13 MED ORDER — FREE WATER
100.0000 mL | Freq: Four times a day (QID) | Status: DC
  Administered 2016-09-13 – 2016-09-15 (×8): 100 mL

## 2016-09-13 MED ORDER — LEVALBUTEROL HCL 1.25 MG/0.5ML IN NEBU
1.2500 mg | INHALATION_SOLUTION | Freq: Four times a day (QID) | RESPIRATORY_TRACT | Status: DC | PRN
Start: 2016-09-13 — End: 2016-09-15

## 2016-09-13 NOTE — Progress Notes (Signed)
Pharmacy Antibiotic Note  Willie Waller is a 61 y.o. male admitted on 09/11/2016 with  pneumonia and sepsis.  Pharmacy has been consulted for Cefepime and Vancomycin dosing. Patient received Ceftriaxone and Azithromycin IV x 1 dose in ED.  CXR shows possible RLL PNA  Plan: Continue cefepime 2 g IV q12h  Height: 5\' 8"  (172.7 cm) Weight: 112 lb 1.6 oz (50.8 kg) IBW/kg (Calculated) : 68.4  Temp (24hrs), Avg:98.6 F (37 C), Min:98.1 F (36.7 C), Max:99.4 F (37.4 C)   Recent Labs Lab 09/10/16 1150 09/11/16 1044 09/11/16 1512 09/11/16 1728 09/12/16 0212 09/13/16 0527  WBC 13.9* 18.4*  --   --  17.9* 14.4*  CREATININE 2.04* 2.35*  --  2.03* 1.76* 1.70*  LATICACIDVEN  --   --  4.3* 2.7*  --   --     Estimated Creatinine Clearance: 33.2 mL/min (by C-G formula based on SCr of 1.7 mg/dL (H)).    No Known Allergies  Antimicrobials this admission: 1/15 ceftriaxone and Azithromycin >> X1 dose 1/15 cefepime >>  1/15 Vancomycin >> 1/16  Dose adjustments this admission: Cefepime q24 to q12 based on improved renal function  Microbiology results: 1/15  BCx: pending NG 2 days 1/15 UCx: NG 1/15 MRSA PCR: negative  Thank you for allowing pharmacy to be a part of this patient's care.  Horris LatinoHolly Mayjor Ager, PharmD Pharmacy Resident 09/13/2016 10:47 AM

## 2016-09-13 NOTE — Progress Notes (Addendum)
[facility] - [facility]   PATIENT NAME: Willie Waller    MR#:  [id number]  DATE OF BIRTH:  10-15-1955  SUBJECTIVE:   Patient here due to sepsis secondary to pneumonia and also noted to have coffee-ground emesis.  Patient has cough, vomiting yesterday. Hemoglobin stable. Leukocytosis is improving.  REVIEW OF SYSTEMS:    Review of Systems  Unable to perform ROS: Mental acuity    Nutrition: tube feeds Tolerating Diet: Yes Tolerating PT: Bedbound     DRUG ALLERGIES:  No Known Allergies  VITALS:  Blood pressure (!) 152/86, pulse 91, temperature 97.5 F (36.4 C), temperature source Oral, resp. rate 17, height 5\' 8"  (1.727 m), weight 112 lb 1.6 oz (50.8 kg), SpO2 97 %.  PHYSICAL EXAMINATION:   Physical Exam  GENERAL:  61 y.o.-year-old patient lying in the bed in no acute distress.  EYES: Pupils equal, round, reactive to light and accommodation. No scleral icterus. Extraocular muscles intact.  HEENT: Head atraumatic, normocephalic. Oropharynx and nasopharynx clear.  NECK:  Supple, no jugular venous distention. No thyroid enlargement, no tenderness.  LUNGS: Normal breath sounds bilaterally, no wheezing, has crackles and rhonchi. No use of accessory muscles of respiration.  CARDIOVASCULAR: S1, S2 normal. No murmurs, rubs, or gallops.  ABDOMEN: Soft, nontender, nondistended. Bowel sounds present. No organomegaly or mass. + PEG tube in place.  EXTREMITIES: No cyanosis, clubbing or edema b/l.    NEUROLOGIC: Cranial nerves II through XII are intact. No focal Motor or sensory deficits b/l. Globally weak   PSYCHIATRIC: The patient is alert and oriented x 1.  SKIN: No obvious rash, lesion, or ulcer.    LABORATORY PANEL:   CBC  Recent Labs Lab 09/13/16 0527  WBC 14.4*  HGB 15.1  HCT 45.4  PLT 173   ------------------------------------------------------------------------------------------------------------------  Chemistries   Recent Labs Lab  09/11/16 1044  09/12/16 0212 09/13/16 0527  NA 145  --  147* 152*  K 3.1*  --  4.7 3.2*  CL 106  --  114* 120*  CO2 26  --  27 25  GLUCOSE 157*  --  118* 120*  BUN 53*  --  44* 36*  CREATININE 2.35*  < > 1.76* 1.70*  CALCIUM 9.1  --  8.6* 8.8*  MG  --   < >  --  2.4  AST 54*  --   --   --   ALT 20  --   --   --   ALKPHOS 75  --   --   --   BILITOT 1.7*  --  1.7*  --   < > = values in this interval not displayed. ------------------------------------------------------------------------------------------------------------------  Cardiac Enzymes No results for input(s): TROPONINI in the last 168 hours. ------------------------------------------------------------------------------------------------------------------  RADIOLOGY:  No results found.   ASSESSMENT AND PLAN:   61 year old male with past medical history of CVA, chronic dysphagia/aphasia, hypertension who presented to the hospital due to sepsis.  1. Sepsis-secondary to pneumonia. UTI ruled out. -Improving, afebrile, hemodynamically stable. Cultures so far negative. Continue IV cefepime.  2. Pneumonia-cause of patient's sepsis. -MRSA PCR negative. Off vancomycin now. Continue IV cefepime. Follow cultures are negative so far.  3. Acute kidney injury-secondary to sepsis. -Improving with IV fluids and follow BUN and creatinine.  4. Hypernatremia-secondary to dehydration. Worsening, continue IV D5W, Start free water by PEG tube, follow BMP.  * Hypokalemia. Give KCl and follow up BMP.  5. Essential hypertension-continue metoprolol.  6. GI bleed - patient suspected to  have GI bleed with coffee-ground emesis. His hemoglobin is also stable. No active bleeding. Continue PPI twice a day. Follow hemoglobin.   All the records are reviewed and case discussed with Care Management/Social Worker. Management plans discussed with the patient, family and they are in agreement.  CODE STATUS: DNR  DVT Prophylaxis: TEd's & SCD's    TOTAL TIME TAKING CARE OF THIS PATIENT: 37 minutes.   POSSIBLE D/C IN 2 DAYS, DEPENDING ON CLINICAL CONDITION.   Shaune Pollackhen, Omeka Holben M.D on 09/13/2016 at 2:40 PM  Between 7am to 6pm - Pager - [phone]  After 6pm go to [email or web address] - Scientist, research (life sciences)password EPAS [facility]  [facility] [place] [facility]  Office  [phone]  CC: Primary care physician; No PCP Per Patient

## 2016-09-14 LAB — GASTROINTESTINAL PANEL BY PCR, STOOL (REPLACES STOOL CULTURE)

## 2016-09-14 LAB — BASIC METABOLIC PANEL
ANION GAP: 4 — AB (ref 5–15)
BUN: 35 mg/dL — ABNORMAL HIGH (ref 6–20)
CHLORIDE: 126 mmol/L — AB (ref 101–111)
CO2: 24 mmol/L (ref 22–32)
Calcium: 8.9 mg/dL (ref 8.9–10.3)
Creatinine, Ser: 1.65 mg/dL — ABNORMAL HIGH (ref 0.61–1.24)
GFR calc non Af Amer: 43 mL/min — ABNORMAL LOW (ref >60)
GFR, EST AFRICAN AMERICAN: 50 mL/min — AB (ref >60)
Glucose, Bld: 110 mg/dL — ABNORMAL HIGH (ref 65–99)
POTASSIUM: 3.3 mmol/L — AB (ref 3.5–5.1)
SODIUM: 154 mmol/L — AB (ref 135–145)

## 2016-09-14 LAB — CBC
HEMATOCRIT: 48.6 % (ref 40.0–52.0)
Hemoglobin: 16.5 g/dL (ref 13.0–18.0)
MCH: 34.4 pg — ABNORMAL HIGH (ref 26.0–34.0)
MCHC: 34 g/dL (ref 32.0–36.0)
MCV: 101.4 fL — AB (ref 80.0–100.0)
PLATELETS: 174 10*3/uL (ref 150–440)
RBC: 4.79 MIL/uL (ref 4.40–5.90)
RDW: 13.4 % (ref 11.5–14.5)
WBC: 15.1 10*3/uL — AB (ref 3.8–10.6)

## 2016-09-14 LAB — C DIFFICILE QUICK SCREEN W PCR REFLEX
C DIFFICILE (CDIFF) TOXIN: NEGATIVE
C Diff antigen: NEGATIVE
C Diff interpretation: NOT DETECTED

## 2016-09-14 MED ORDER — POTASSIUM CL IN DEXTROSE 5% 20 MEQ/L IV SOLN
20.0000 meq | INTRAVENOUS | Status: DC
  Administered 2016-09-14 (×2): 20 meq via INTRAVENOUS
  Filled 2016-09-14 (×5): qty 1000

## 2016-09-14 MED ORDER — POTASSIUM CHLORIDE 20 MEQ/15ML (10%) PO SOLN
40.0000 meq | Freq: Once | ORAL | Status: AC
  Administered 2016-09-14: 40 meq via ORAL
  Filled 2016-09-14: qty 30

## 2016-09-14 MED ORDER — AMOXICILLIN-POT CLAVULANATE 400-57 MG/5ML PO SUSR
875.0000 mg | Freq: Two times a day (BID) | ORAL | Status: DC
  Administered 2016-09-14 – 2016-09-15 (×2): 872 mg
  Filled 2016-09-14 (×3): qty 10.9

## 2016-09-14 NOTE — Progress Notes (Signed)
Pharmacy Antibiotic Note  Willie Waller is a 61 y.o. male admitted on 09/11/2016 with pneumonia and Sepsis.  Pharmacy has been consulted for Augmentin dosing for PEG.  Plan: Will start patient on Augmentin 875mg  BID based on current CrCl >4030ml/min.   Height: 5\' 8"  (172.7 cm) Weight: 112 lb 1.6 oz (50.8 kg) IBW/kg (Calculated) : 68.4  Temp (24hrs), Avg:98.2 F (36.8 C), Min:98 F (36.7 C), Max:98.4 F (36.9 C)   Recent Labs Lab 09/10/16 1150 09/11/16 1044 09/11/16 1512 09/11/16 1728 09/12/16 0212 09/13/16 0527 09/14/16 0354  WBC 13.9* 18.4*  --   --  17.9* 14.4* 15.1*  CREATININE 2.04* 2.35*  --  2.03* 1.76* 1.70* 1.65*  LATICACIDVEN  --   --  4.3* 2.7*  --   --   --     Estimated Creatinine Clearance: 33.8 mL/min (by C-G formula based on SCr of 1.65 mg/dL (H)).    No Known Allergies  Antimicrobials this admission: 1/15 Cefepime >> 1/18 1/18 Augmentin >>  Dose adjustments this admission:  Microbiology results: 1/15  BCx: NG x 3 days  1/15 MRSA PCR: negative   Thank you for allowing pharmacy to be a part of this patient's care.  Gardner CandleSheema M Leili Eskenazi, PharmD, BCPS Clinical Pharmacist 09/14/2016 4:09 PM

## 2016-09-14 NOTE — Progress Notes (Signed)
[facility] - [place] [facility]   PATIENT NAME: Willie Waller    MR#:  [id number]  DATE OF BIRTH:  1956-05-28  SUBJECTIVE:   Patient here due to sepsis secondary to pneumonia and also noted to have coffee-ground emesis.  Patient has no vomiting or diarrhea. Hemoglobin stable.  REVIEW OF SYSTEMS:    Review of Systems  Unable to perform ROS: Mental acuity    Nutrition: tube feeds Tolerating Diet: Yes Tolerating PT: Bedbound  DRUG ALLERGIES:  No Known Allergies  VITALS:  Blood pressure (!) 153/87, pulse (!) 103, temperature 98.4 F (36.9 C), temperature source Oral, resp. rate 15, height 5\' 8"  (1.727 m), weight 112 lb 1.6 oz (50.8 kg), SpO2 97 %.  PHYSICAL EXAMINATION:   Physical Exam  GENERAL:  61 y.o.-year-old patient lying in the bed in no acute distress.  EYES: Pupils equal, round, reactive to light and accommodation. No scleral icterus. Extraocular muscles intact.  HEENT: Head atraumatic, normocephalic. Oropharynx and nasopharynx clear.  NECK:  Supple, no jugular venous distention. No thyroid enlargement, no tenderness.  LUNGS: Normal breath sounds bilaterally, no wheezing, has crackles and rhonchi. No use of accessory muscles of respiration.  CARDIOVASCULAR: S1, S2 normal. No murmurs, rubs, or gallops.  ABDOMEN: Soft, nontender, nondistended. Bowel sounds present. No organomegaly or mass. + PEG tube in place.  EXTREMITIES: No cyanosis, clubbing or edema b/l.    NEUROLOGIC:  Aphasia, No focal Motor or sensory deficits b/l. Globally weak   PSYCHIATRIC: The patient is nonverbal. SKIN: No obvious rash, lesion, or ulcer.    LABORATORY PANEL:   CBC  Recent Labs Lab 09/14/16 0354  WBC 15.1*  HGB 16.5  HCT 48.6  PLT 174   ------------------------------------------------------------------------------------------------------------------  Chemistries   Recent Labs Lab 09/11/16 1044  09/12/16 0212 09/13/16 0527 09/14/16 0354  NA 145  --   147* 152* 154*  K 3.1*  --  4.7 3.2* 3.3*  CL 106  --  114* 120* 126*  CO2 26  --  27 25 24   GLUCOSE 157*  --  118* 120* 110*  BUN 53*  --  44* 36* 35*  CREATININE 2.35*  < > 1.76* 1.70* 1.65*  CALCIUM 9.1  --  8.6* 8.8* 8.9  MG  --   < >  --  2.4  --   AST 54*  --   --   --   --   ALT 20  --   --   --   --   ALKPHOS 75  --   --   --   --   BILITOT 1.7*  --  1.7*  --   --   < > = values in this interval not displayed. ------------------------------------------------------------------------------------------------------------------  Cardiac Enzymes No results for input(s): TROPONINI in the last 168 hours. ------------------------------------------------------------------------------------------------------------------  RADIOLOGY:  No results found.   ASSESSMENT AND PLAN:   61 year old male with past medical history of CVA, chronic dysphagia/aphasia, hypertension who presented to the hospital due to sepsis.  1. Sepsis-secondary to pneumonia. UTI ruled out. -Improving, afebrile, hemodynamically stable. Cultures so far negative. discontinue IV cefepime, change to augmentin.  2. Pneumonia-cause of patient's sepsis. -MRSA PCR negative. Off vancomycin now. discontinue IV cefepime. change to augmentin. Follow cultures are negative so far.  3. Acute kidney injury-secondary to sepsis. -Improving with IV fluids and follow BUN and creatinine.  4. Hypernatremia-secondary to dehydration. Worsening, continue IV D5W, Started free water by PEG tube, follow BMP.  * Hypokalemia. Given KCl  and follow up BMP.  5. Essential hypertension-continue metoprolol.  6. GI bleed - patient suspected to have GI bleed with coffee-ground emesis. His hemoglobin is also stable. No active bleeding. Continue PPI twice a day. Follow hemoglobin.   All the records are reviewed and case discussed with Care Management/Social Worker. Management plans discussed with the patient, family and they are in  agreement.  CODE STATUS: DNR  DVT Prophylaxis: TEd's & SCD's   TOTAL TIME TAKING CARE OF THIS PATIENT: 36 minutes.   POSSIBLE D/C to SNF IN 2 DAYS, DEPENDING ON CLINICAL CONDITION.   Shaune Pollack M.D on 09/14/2016 at 3:51 PM  Between 7am to 6pm - Pager - [phone]  After 6pm go to [email or web address] - Scientist, research (life sciences) [place] [facility]  Office  [phone]  CC: Primary care physician; No PCP Per Patient

## 2016-09-14 NOTE — Clinical Social Work Note (Signed)
Clinical Social Work Assessment  Patient Details  Name: Willie Waller MRN: [id number] Date of Birth: Aug 17, 1956  Date of referral:  09/14/16               Reason for consult:  Facility Placement                Permission sought to share information with:    Permission granted to share information::   (patient is non-verbal)  Name::        Agency::     Relationship::     Contact Information:     Housing/Transportation Living arrangements for the past 2 months:  [facility] Facility Source of Information:  Adult Children Patient Interpreter Needed:  None Criminal Activity/Legal Involvement Pertinent to Current Situation/Hospitalization:  No - Comment as needed Significant Relationships:  Adult Children Lives with:  Facility Resident Do you feel safe going back to the place where you live?  Yes Need for family participation in patient care:  Yes (Comment)  Care giving concerns:  [facility]   Social Worker assessment / plan:  CSW unable to assess patient as patient is non verbal and not able to communicate. CSW contacted patient's daughter: Kandyce RudKellin Wilkes: [phone]: [phone] and she confirmed that her [facility] is a [facility] and that she wishes for him to return at discharge. She stated that [facility] has [facility] about 6 [facility]7 years. Patient's daughter has no concerns or questions at this time.  Employment status:  Unemployed Health and safety inspectornsurance information:  [place] Recommendations:  Not assessed at this time Information / Referral to community resources:     Patient/Family's Response to care:  Patient's daughter expressed appreciation for CSW assistance.  Patient/Family's Understanding of and Emotional Response to Diagnosis, Current Treatment, and Prognosis:  Patient's daughter was aware patient was in the hospital and verbalized agreement with plan to have him return to his facility at discharge. Patient's daughter was very  receptive to CSW phone call.  Emotional Assessment Appearance:  Appears stated age Attitude/Demeanor/Rapport:  Unable to Assess Affect (typically observed):  Unable to Assess Orientation:   (non verbal) Alcohol / Substance use:  Not Applicable Psych involvement (Current and /or in the community):  No (Comment)  Discharge Needs  Concerns to be addressed:  Care Coordination Readmission within the last 30 days:  No Current discharge risk:  None Barriers to Discharge:  No Barriers Identified   York SpanielMonica Sandrine Bloodsworth, LCSW 09/14/2016, 12:15 PM

## 2016-09-14 NOTE — Plan of Care (Signed)
Problem: Skin Integrity: Goal: Risk for impaired skin integrity will decrease Outcome: Progressing Pt changed frequently and barrier cream used for excessive diarrhea and incontinence.

## 2016-09-14 NOTE — Progress Notes (Signed)
Nutrition Follow-up  DOCUMENTATION CODES:   Severe malnutrition in context of chronic illness, Underweight  INTERVENTION:  -Continue with current TF regimen  NUTRITION DIAGNOSIS:   Malnutrition related to chronic illness as evidenced by severe depletion of muscle mass, severe depletion of body fat.  Being addressed via TF   GOAL:   Patient will meet greater than or equal to 90% of their needs  MONITOR:   TF tolerance, Labs, Weight trends, I & O's, Skin  REASON FOR ASSESSMENT:   Consult Enteral/tube feeding initiation and management  ASSESSMENT:   61 yo male admitted with multiple episodes of cofffee-ground emesis, sepsis due to pneumonia and UTI, ARF due to dehydration/ATN. Pt with hx of CVA with aphasia and dysphagia s/p PEG tube placement, HTN. Pt is non-verbal but can shake head "yes" or "no" appropriately with questions at times per RN  Pt currently tolerating bolus feedings of Osmolite 1.5 237 mL (1 can) 5 times daily Worsening hypernatremia, D5 increased today, pt also receiving free water flushes (100 mL q 6 hours) Pt vomitted early on admission but none today  [facility]. Pt has been tolerating TwoCal HN bolus feedings; per RD, pt also takes an oral diet (Dysphagia I, Nectar Thick diet) but for pleasure only. RD also reports that TF prescription was recently increased from 3 cans of TwoCal HN per day to 4 cans per day. Pt weight has been stable recently, last weighed on 121.2 pounds on 08/30/16. Current wt of 112 pounds. 7.4% wt loss  Labs: sodium 154, potassium 3.3, CL 126 Meds: D5 with KCl at 100 ml/hr  Diet Order:  Diet NPO time specified  Skin:  Reviewed, no issues  Last BM:  09/14/16  Height:   Ht Readings from Last 1 Encounters:  09/11/16 5\' 8"  (1.727 m)    Weight:   Wt Readings from Last 1 Encounters:  09/11/16 112 lb 1.6 oz (50.8 kg)   Filed Weights   09/11/16 1031  Weight: 112 lb 1.6 oz (50.8 kg)    BMI:  Body mass  index is 17.04 kg/m.  Estimated Nutritional Needs:   Kcal:  1530-1785 kcals  Protein:  75-90 g  Fluid:  >/= 1.5 L  EDUCATION NEEDS:   No education needs identified at this time  Romelle StarcherCate Zoltan Genest MS, RD, LDN [phone] Pager  [phone] Weekend/On-Call Pager

## 2016-09-15 LAB — CBC
HEMATOCRIT: 41.8 % (ref 40.0–52.0)
HEMOGLOBIN: 14.1 g/dL (ref 13.0–18.0)
MCH: 34.1 pg — AB (ref 26.0–34.0)
MCHC: 33.9 g/dL (ref 32.0–36.0)
MCV: 100.8 fL — ABNORMAL HIGH (ref 80.0–100.0)
Platelets: 175 10*3/uL (ref 150–440)
RBC: 4.15 MIL/uL — AB (ref 4.40–5.90)
RDW: 13.1 % (ref 11.5–14.5)
WBC: 13.8 10*3/uL — ABNORMAL HIGH (ref 3.8–10.6)

## 2016-09-15 LAB — BASIC METABOLIC PANEL
ANION GAP: 7 (ref 5–15)
BUN: 32 mg/dL — ABNORMAL HIGH (ref 6–20)
CALCIUM: 8.3 mg/dL — AB (ref 8.9–10.3)
CO2: 19 mmol/L — AB (ref 22–32)
Chloride: 120 mmol/L — ABNORMAL HIGH (ref 101–111)
Creatinine, Ser: 1.3 mg/dL — ABNORMAL HIGH (ref 0.61–1.24)
GFR calc non Af Amer: 58 mL/min — ABNORMAL LOW (ref >60)
Glucose, Bld: 119 mg/dL — ABNORMAL HIGH (ref 65–99)
POTASSIUM: 3.5 mmol/L (ref 3.5–5.1)
Sodium: 146 mmol/L — ABNORMAL HIGH (ref 135–145)

## 2016-09-15 MED ORDER — LEVALBUTEROL HCL 1.25 MG/0.5ML IN NEBU: 1.2500 mg | INHALATION_SOLUTION | Freq: Four times a day (QID) | RESPIRATORY_TRACT | 2 refills | Status: DC | PRN

## 2016-09-15 MED ORDER — AMOXICILLIN-POT CLAVULANATE 400-57 MG/5ML PO SUSR
875.0000 mg | Freq: Two times a day (BID) | ORAL | 0 refills | Status: DC
Start: 2016-09-15 — End: 2018-03-23

## 2016-09-15 MED ORDER — GUAIFENESIN 100 MG/5ML PO SOLN: 5.0000 mL | ORAL | 0 refills | Status: DC | PRN

## 2016-09-15 NOTE — Progress Notes (Signed)
Pt to be discharge per MD order. IV removed. AVS printed and placed in packet that was [facility]. [facility]. Will discharge via EMS.

## 2016-09-15 NOTE — Clinical Social Work Note (Signed)
MD has discharged patient for today to return [facility]. CSW has [facility] Stanton [facility] and sent discharge instructions. CSW contacted patient's daughter to notify her. Patient to transport via EMS. York SpanielMonica Hosanna Betley MSW,LCSW [phone]

## 2016-09-15 NOTE — Discharge Summary (Signed)
[employer] - [place] [facility]   PATIENT NAME: Willie Waller    MR#:  [id number]  DATE OF BIRTH:  03/14/1956  DATE OF ADMISSION:  09/11/2016   ADMITTING PHYSICIAN: Shaune Pollack, MD  DATE OF DISCHARGE: 09/15/2016  PRIMARY CARE PHYSICIAN: No PCP Per Patient   ADMISSION DIAGNOSIS:  Hypokalemia [E87.6] Urinary tract infection without hematuria, site unspecified [N39.0] Acute renal failure, unspecified acute renal failure type (HCC) [N17.9] Pneumonia of right lower lobe due to infectious organism (HCC) [J18.1] Non-intractable vomiting without nausea, unspecified vomiting type [R11.11] DISCHARGE DIAGNOSIS:  Active Problems:   Sepsis (HCC)   Protein-calorie malnutrition, severe  SECONDARY DIAGNOSIS:   Past Medical History:  Diagnosis Date  . Aphasia   . Dysphagia   . Hypertension   . Stroke [facility])    HOSPITAL COURSE:   61 year old male with past medical history of CVA, chronic dysphagia/aphasia, hypertension who presented to the hospital due to sepsis.  1. Sepsis-secondary to pneumonia. UTI ruled out. -Improved, afebrile, hemodynamically stable. Cultures so far negative. discontinued IV cefepime, changed to augmentin for 7 more days.  2. Pneumonia-cause of patient's sepsis. -MRSA PCR negative. Off vancomycin now. discontinued IV cefepime. changed to augmentin. Follow cultures are negative so far.  3. Acute kidney injury-secondary to sepsis. -Improved with IV fluids.  4. Hypernatremia-secondary to dehydration. Improved with IV D5W, Started free water by PEG tube, follow BMP as outpatient.  * Hypokalemia. Given KCl and improved.  5. Essential hypertension-continue metoprolol.  6. GI bleed - patient suspected to have GI bleed with coffee-ground emesis. His hemoglobin is also stable. No active bleeding. Continue PPI.  DISCHARGE CONDITIONS:  Stable, discharge to SNF today. CONSULTS OBTAINED:   DRUG ALLERGIES:  No Known Allergies DISCHARGE  MEDICATIONS:   Allergies as of 09/15/2016   No Known Allergies     Medication List    STOP taking these medications   amoxicillin-clavulanate 500-125 MG tablet Commonly known as:  AUGMENTIN Replaced by:  amoxicillin-clavulanate 400-57 MG/5ML suspension     TAKE these medications   acetaminophen 325 MG tablet Commonly known as:  TYLENOL Take 650 mg by mouth every 6 (six) hours as needed.   amoxicillin-clavulanate 400-57 MG/5ML suspension Commonly known as:  AUGMENTIN Place 10.9 mLs (872 mg total) into feeding tube every 12 (twelve) hours. Replaces:  amoxicillin-clavulanate 500-125 MG tablet   aspirin 81 MG chewable tablet Chew by mouth daily.   calcium carbonate 1500 (600 Ca) MG Tabs tablet Commonly known as:  OSCAL Take by mouth 2 (two) times daily with a meal.   divalproex 125 MG capsule Commonly known as:  DEPAKOTE SPRINKLE Take 125 mg by mouth daily.   guaiFENesin 100 MG/5ML Soln Commonly known as:  ROBITUSSIN Take 5 mLs (100 mg total) by mouth every 4 (four) hours as needed for cough or to loosen phlegm.   ketoconazole 2 % cream Commonly known as:  NIZORAL Apply 1 application topically 2 (two) times daily as needed for irritation.   levalbuterol 1.25 MG/0.5ML nebulizer solution Commonly known as:  XOPENEX Take 1.25 mg by nebulization every 6 (six) hours as needed for wheezing or shortness of breath.   metoprolol 100 MG tablet Commonly known as:  LOPRESSOR Take 100 mg by mouth 2 (two) times daily.   ondansetron 4 MG tablet Commonly known as:  ZOFRAN Take 4 mg by mouth every 6 (six) hours as needed for nausea or vomiting.   pantoprazole 40 MG tablet Commonly known as:  PROTONIX Take 40 mg  by mouth daily.   TWOCAL HN Liqd Take 237 mLs by mouth 4 (four) times daily.        DISCHARGE INSTRUCTIONS:  See AVS.  If you experience worsening of your admission symptoms, develop shortness of breath, life threatening emergency, suicidal or homicidal thoughts  you must seek medical attention immediately by calling 911 or calling your MD immediately  if symptoms less severe.  You Must read complete instructions/literature along with all the possible adverse reactions/side effects for all the Medicines you take and that have been prescribed to you. Take any new Medicines after you have completely understood and accpet all the possible adverse reactions/side effects.   Please note  You were cared for by a hospitalist during your hospital stay. If you have any questions about your discharge medications or the care you received while you were in the hospital after you are discharged, you can call the unit and asked to speak with the hospitalist on call if the hospitalist that took care of you is not available. Once you are discharged, your primary care physician will handle any further medical issues. Please note that NO REFILLS for any discharge medications will be authorized once you are discharged, as it is imperative that you return to your primary care physician (or establish a relationship with a primary care physician if you do not have one) for your aftercare needs so that they can reassess your need for medications and monitor your lab values.    On the day of Discharge:  VITAL SIGNS:  Blood pressure 121/74, pulse 96, temperature 97.5 F (36.4 C), temperature source Oral, resp. rate 16, height 5\' 8"  (1.727 m), weight 112 lb 1.6 oz (50.8 kg), SpO2 97 %. PHYSICAL EXAMINATION:  GENERAL:  61 y.o.-year-old patient lying in the bed with no acute distress.  EYES: Pupils equal, round, reactive to light and accommodation. No scleral icterus. Extraocular muscles intact.  HEENT: Head atraumatic, normocephalic. Oropharynx and nasopharynx clear.  NECK:  Supple, no jugular venous distention. No thyroid enlargement, no tenderness.  LUNGS: Normal breath sounds bilaterally, no wheezing, rales,rhonchi or crepitation. No use of accessory muscles of respiration.    CARDIOVASCULAR: S1, S2 normal. No murmurs, rubs, or gallops.  ABDOMEN: Soft, non-tender, non-distended. Bowel sounds present. No organomegaly or mass.  EXTREMITIES: No pedal edema, cyanosis, or clubbing.  NEUROLOGIC: Aphagia. Muscle strength 4/5 in all extremities.  PSYCHIATRIC: The patient is nonverbal. SKIN: No obvious rash, lesion, or ulcer.  DATA REVIEW:   CBC  Recent Labs Lab 09/15/16 0639  WBC 13.8*  HGB 14.1  HCT 41.8  PLT 175    Chemistries   Recent Labs Lab 09/11/16 1044  09/12/16 0212 09/13/16 0527  09/15/16 0639  NA 145  --  147* 152*  < > 146*  K 3.1*  --  4.7 3.2*  < > 3.5  CL 106  --  114* 120*  < > 120*  CO2 26  --  27 25  < > 19*  GLUCOSE 157*  --  118* 120*  < > 119*  BUN 53*  --  44* 36*  < > 32*  CREATININE 2.35*  < > 1.76* 1.70*  < > 1.30*  CALCIUM 9.1  --  8.6* 8.8*  < > 8.3*  MG  --   < >  --  2.4  --   --   AST 54*  --   --   --   --   --   ALT 20  --   --   --   --   --  ALKPHOS 75  --   --   --   --   --   BILITOT 1.7*  --  1.7*  --   --   --   < > = values in this interval not displayed.   Microbiology Results  Results for orders placed or performed during the hospital encounter of 09/11/16  Culture, blood (routine x 2)     Status: None (Preliminary result)   Collection Time: 09/11/16  1:13 PM  Result Value Ref Range Status   Specimen Description BLOOD LEFT ASSIST CONTROL  Final   Special Requests   Final    BOTTLES DRAWN AEROBIC AND ANAEROBIC  AEROBIC 8CC, ANAEROBIC 12CC   Culture NO GROWTH 4 DAYS  Final   Report Status PENDING  Incomplete  Culture, blood (routine x 2)     Status: None (Preliminary result)   Collection Time: 09/11/16  1:13 PM  Result Value Ref Range Status   Specimen Description BLOOD RIGHT ASSIST CONTROL  Final   Special Requests BOTTLES DRAWN AEROBIC AND ANAEROBIC  10CC  Final   Culture NO GROWTH 4 DAYS  Final   Report Status PENDING  Incomplete  Urine culture     Status: None   Collection Time: 09/11/16   1:43 PM  Result Value Ref Range Status   Specimen Description URINE, CLEAN CATCH  Final   Special Requests NONE  Final   Culture   Final    NO GROWTH Performed at [facility], [address] [place], [place] [address]    Report Status 09/13/2016 FINAL  Final  MRSA PCR Screening     Status: None   Collection Time: 09/11/16  6:40 PM  Result Value Ref Range Status   MRSA by PCR NEGATIVE NEGATIVE Final    Comment:        The GeneXpert MRSA Assay (FDA approved for NASAL specimens only), is one component of a comprehensive MRSA colonization surveillance program. It is not intended to diagnose MRSA infection nor to guide or monitor treatment for MRSA infections.   Gastrointestinal Panel by PCR , Stool     Status: None   Collection Time: 09/14/16  6:08 AM  Result Value Ref Range Status   Campylobacter species NOT DETECTED NOT DETECTED Final   Plesimonas shigelloides NOT DETECTED NOT DETECTED Final   Salmonella species NOT DETECTED NOT DETECTED Final   Yersinia enterocolitica NOT DETECTED NOT DETECTED Final   Vibrio species NOT DETECTED NOT DETECTED Final   Vibrio cholerae NOT DETECTED NOT DETECTED Final   Enteroaggregative E coli (EAEC) NOT DETECTED NOT DETECTED Final   Enteropathogenic E coli (EPEC) NOT DETECTED NOT DETECTED Final   Enterotoxigenic E coli (ETEC) NOT DETECTED NOT DETECTED Final   Shiga like toxin producing E coli (STEC) NOT DETECTED NOT DETECTED Final   Shigella/Enteroinvasive E coli (EIEC) NOT DETECTED NOT DETECTED Final   Cryptosporidium NOT DETECTED NOT DETECTED Final   Cyclospora cayetanensis NOT DETECTED NOT DETECTED Final   Entamoeba histolytica NOT DETECTED NOT DETECTED Final   Giardia lamblia NOT DETECTED NOT DETECTED Final   Adenovirus F40/41 NOT DETECTED NOT DETECTED Final   Astrovirus NOT DETECTED NOT DETECTED Final   Norovirus GI/GII NOT DETECTED NOT DETECTED Final   Rotavirus A NOT DETECTED NOT DETECTED Final   Sapovirus (I, II, IV, and V)  NOT DETECTED NOT DETECTED Final  C difficile quick scan w PCR reflex     Status: None   Collection Time: 09/14/16 12:25 PM  Result Value Ref Range Status   C Diff antigen NEGATIVE NEGATIVE Final   C Diff toxin NEGATIVE NEGATIVE Final   C Diff interpretation No C. difficile detected.  Final    RADIOLOGY:  No results found.   Management plans discussed with the patient, family and they are in agreement.  CODE STATUS:     Code Status Orders        Start     Ordered   09/11/16 1644  Do not attempt resuscitation (DNR)  Continuous    Question Answer Comment  In the event of cardiac or respiratory ARREST Do not call a "code blue"   In the event of cardiac or respiratory ARREST Do not perform Intubation, CPR, defibrillation or ACLS   In the event of cardiac or respiratory ARREST Use medication by any route, position, wound care, and other measures to relive pain and suffering. May use oxygen, suction and manual treatment of airway obstruction as needed for comfort.      09/11/16 1643    Code Status History    Date Active Date Inactive Code Status Order ID Comments User Context   This patient has a current code status but no historical code status.    Advance Directive Documentation   Flowsheet Row Most Recent Value  Type of Advance Directive  Healthcare Power of Attorney  Pre-existing out of facility DNR order (yellow form or pink MOST form)  No data  "MOST" Form in Place?  No data      TOTAL TIME TAKING CARE OF THIS PATIENT: 36 minutes.    Shaune Pollack M.D on 09/15/2016 at 10:57 AM  Between 7am to 6pm - Pager - [phone]  After 6pm go to [email or web address] - Scientist, research (life sciences) [facility]  Office  [phone]  CC: Primary care physician; No PCP Per Patient   Note: This dictation was prepared with Dragon dictation along with smaller phrase technology. Any transcriptional errors that result from this process are unintentional.

## 2016-09-15 NOTE — Discharge Instructions (Signed)
Continue PEG feeding. Aspiration and fall precaution, suction prn

## 2016-09-16 LAB — CULTURE, BLOOD (ROUTINE X 2)
CULTURE: NO GROWTH
Culture: NO GROWTH

## 2017-05-28 ENCOUNTER — Other Ambulatory Visit: Payer: Self-pay | Admitting: Family Medicine

## 2017-05-28 DIAGNOSIS — R1312 Dysphagia, oropharyngeal phase: Secondary | ICD-10-CM

## 2017-06-20 ENCOUNTER — Ambulatory Visit
Admission: RE | Admit: 2017-06-20 | Discharge: 2017-06-20 | Disposition: A | Discharge status: Home / Self Care | Payer: Medicaid Other | Source: Ambulatory Visit | Attending: Family Medicine | Admitting: Family Medicine

## 2017-06-20 DIAGNOSIS — R1312 Dysphagia, oropharyngeal phase: Secondary | ICD-10-CM | POA: Insufficient documentation

## 2017-06-20 NOTE — Therapy (Signed)
[facility] DIAGNOSTIC RADIOLOGY [address] [place], [place], [address] Phone: [phone]   Fax:     Modified Barium Swallow  Patient Details  Name: Willie Furlongrnest Aylesworth MRN: [id number] Date of Birth: 03-May-1956 No Data Recorded  Encounter Date: 06/20/2017      End of Session - 06/20/17 1552    Visit Number 1   Number of Visits 1   Date for SLP Re-Evaluation 06/20/17   SLP Start Time 1300   SLP Stop Time  1400   SLP Time Calculation (min) 60 min      Past Medical History:  Diagnosis Date  . Aphasia   . Dysphagia   . Hypertension   . Stroke [facility](HCC)     Past Surgical History:  Procedure Laterality Date  . PEG PLACEMENT      There were no vitals filed for this visit.   Subjective: Patient behavior: (alertness, ability to follow instructions, etc.): Patient is non-verbal and does not consistently follow directions.  Patient has a wet cough baseline  Chief complaint: Records unavailable; history of remote stroke with aphasia/dysphagia/PEG   Objective:  Radiological Procedure: A videoflouroscopic evaluation of oral-preparatory, reflex initiation, and pharyngeal phases of the swallow was performed; as well as a screening of the upper esophageal phase.  I. POSTURE: Upright in MBS chair  II. VIEW: Lateral  III. COMPENSATORY STRATEGIES: Verbal cues do not elicit second swallow  IV. BOLUSES ADMINISTERED:   Thin Liquid: DNT   Nectar-thick Liquid: 3 cup rim sips   Honey-thick Liquid: DNT   Puree: 2 teaspoon presentations   Mechanical Soft: DNT  V. RESULTS OF EVALUATION: A. ORAL PREPARATORY PHASE: (The lips, tongue, and velum are observed for strength and coordination)       **Overall Severity Rating: Moderate-severe; disorganized, piecemeal, oral residue  B. SWALLOW INITIATION/REFLEX: (The reflex is normal if "triggered" by the time the bolus reached the base of the tongue)  **Overall Severity Rating: Moderate-severe; triggers  at pyriform sinuses with liquid  C. PHARYNGEAL PHASE: (Pharyngeal function is normal if the bolus shows rapid, smooth, and continuous transit through the pharynx and there is no pharyngeal residue after the swallow)  **Overall Severity Rating: Severe; reduced tongue base retraction, reduced hyolaryngeal excursion, incomplete (to absent) epiglottic inversion; severe pharyngeal residue  D. LARYNGEAL PENETRATION: (Material entering into the laryngeal inlet/vestibule but not aspirated) Penetration of pharyngeal residue  E. ASPIRATION: aspiration of oral residue falling into pharynx F. ESOPHAGEAL PHASE: (Screening of the upper esophagus)  ASSESSMENT:This 61 year old man; with history of stroke (date unknown, but prior to 2011) with aphasia/dysphagia/PEG and recent hospitalization for RLL pneumonia in January 2018; is presenting with severe oropharyngeal dysphagia characterized by slow/disorganized oral management, oral residue (up to  of a bolus within the oral cavity), delayed swallow initiation, decreased tongue base retraction, incomplete / absent epiglottic inversion, moderate-severe pharyngeal residue, deep laryngeal penetration of pharyngeal residue, and aspiration of oral residue (falling into the pharynx) with delayed cough.  The patient is at significant risk for chronic aspiration of any consistency as it is the pharyngeal and oral residue that is aspirated.  Recommend the patient remain NPO and to continue the pleasure feeding as tolerated.  The patient should be closely monitored for signs/symptoms of pneumonia and remain as physically active as possible.  Recommend stringent and frequent oral care to reduce risk for aspiration of oral bacteria.  PLAN/RECOMMENDATIONS:   A. Diet: NPO with pleasure feeds as tolerated   B. Swallowing Precautions: Stringent  oral care   C. Recommended consultation to N/A   D. Therapy recommendations: patient/family/staff education   E. Results and  recommendations were discussed with the patient immediately following the study and the final report routed to the referring MD and treating SLP     Patient will benefit from skilled therapeutic intervention in order to improve the following deficits and impairments:   Oropharyngeal dysphagia - Plan: DG Swallowing Func-Speech Pathology, DG Swallowing Func-Speech Pathology      G-Codes - Jun 28, 2017 1551    Functional Assessment Tool Used MBS, clinicla judgment   Functional Limitations Swallowing   Swallow Current Status (O9629) At least 80 percent but less than 100 percent impaired, limited or restricted   Swallow Goal Status (B2841) At least 80 percent but less than 100 percent impaired, limited or restricted   Swallow Discharge Status [phone]) At least 80 percent but less than 100 percent impaired, limited or restricted          Problem List Patient Active Problem List   Diagnosis Date Noted  . Protein-calorie malnutrition, severe 09/12/2016  . Sepsis (HCC) 09/11/2016    Dollene Primrose, MS/CCC- SLP  Leandrew Koyanagi 2017-06-28, 3:53 PM  [facility] DIAGNOSTIC RADIOLOGY [address] [place], [place], [address] Phone: [phone]   Fax:     Name: Willie Waller MRN: [id number] Date of Birth: [birth year]

## 2018-03-23 ENCOUNTER — Emergency Department: Payer: Medicaid Other | Admitting: Anesthesiology

## 2018-03-23 ENCOUNTER — Inpatient Hospital Stay: Payer: Medicaid Other

## 2018-03-23 ENCOUNTER — Emergency Department: Payer: Medicaid Other

## 2018-03-23 ENCOUNTER — Encounter: Admission: EM | Disposition: E | Payer: Self-pay | Source: Home / Self Care | Attending: Vascular Surgery

## 2018-03-23 ENCOUNTER — Other Ambulatory Visit: Payer: Self-pay

## 2018-03-23 ENCOUNTER — Inpatient Hospital Stay
Admission: EM | Admit: 2018-03-23 | Discharge: 2018-03-28 | DRG: 982 | Disposition: E | Payer: Medicaid Other | Attending: Vascular Surgery | Admitting: Vascular Surgery

## 2018-03-23 ENCOUNTER — Encounter: Payer: Self-pay | Admitting: Radiology

## 2018-03-23 DIAGNOSIS — R112 Nausea with vomiting, unspecified: Secondary | ICD-10-CM

## 2018-03-23 DIAGNOSIS — F1910 Other psychoactive substance abuse, uncomplicated: Secondary | ICD-10-CM | POA: Diagnosis present

## 2018-03-23 DIAGNOSIS — R1310 Dysphagia, unspecified: Secondary | ICD-10-CM | POA: Diagnosis present

## 2018-03-23 DIAGNOSIS — K922 Gastrointestinal hemorrhage, unspecified: Secondary | ICD-10-CM | POA: Diagnosis present

## 2018-03-23 DIAGNOSIS — J9690 Respiratory failure, unspecified, unspecified whether with hypoxia or hypercapnia: Secondary | ICD-10-CM | POA: Diagnosis present

## 2018-03-23 DIAGNOSIS — Z66 Do not resuscitate: Secondary | ICD-10-CM | POA: Diagnosis present

## 2018-03-23 DIAGNOSIS — Z8249 Family history of ischemic heart disease and other diseases of the circulatory system: Secondary | ICD-10-CM

## 2018-03-23 DIAGNOSIS — J690 Pneumonitis due to inhalation of food and vomit: Secondary | ICD-10-CM | POA: Diagnosis present

## 2018-03-23 DIAGNOSIS — Z681 Body mass index (BMI) 19 or less, adult: Secondary | ICD-10-CM | POA: Diagnosis not present

## 2018-03-23 DIAGNOSIS — F0150 Vascular dementia without behavioral disturbance: Secondary | ICD-10-CM | POA: Diagnosis present

## 2018-03-23 DIAGNOSIS — R579 Shock, unspecified: Secondary | ICD-10-CM | POA: Diagnosis present

## 2018-03-23 DIAGNOSIS — R64 Cachexia: Secondary | ICD-10-CM | POA: Diagnosis present

## 2018-03-23 DIAGNOSIS — I7410 Embolism and thrombosis of unspecified parts of aorta: Secondary | ICD-10-CM | POA: Diagnosis present

## 2018-03-23 DIAGNOSIS — D696 Thrombocytopenia, unspecified: Secondary | ICD-10-CM | POA: Diagnosis present

## 2018-03-23 DIAGNOSIS — N179 Acute kidney failure, unspecified: Secondary | ICD-10-CM | POA: Diagnosis present

## 2018-03-23 DIAGNOSIS — I998 Other disorder of circulatory system: Secondary | ICD-10-CM | POA: Diagnosis present

## 2018-03-23 DIAGNOSIS — K219 Gastro-esophageal reflux disease without esophagitis: Secondary | ICD-10-CM | POA: Diagnosis present

## 2018-03-23 DIAGNOSIS — I7092 Chronic total occlusion of artery of the extremities: Secondary | ICD-10-CM | POA: Diagnosis present

## 2018-03-23 DIAGNOSIS — E860 Dehydration: Secondary | ICD-10-CM | POA: Diagnosis present

## 2018-03-23 DIAGNOSIS — R0902 Hypoxemia: Secondary | ICD-10-CM

## 2018-03-23 DIAGNOSIS — K920 Hematemesis: Secondary | ICD-10-CM | POA: Diagnosis not present

## 2018-03-23 DIAGNOSIS — E870 Hyperosmolality and hypernatremia: Secondary | ICD-10-CM | POA: Diagnosis present

## 2018-03-23 DIAGNOSIS — R627 Adult failure to thrive: Secondary | ICD-10-CM | POA: Diagnosis present

## 2018-03-23 DIAGNOSIS — Z8701 Personal history of pneumonia (recurrent): Secondary | ICD-10-CM

## 2018-03-23 DIAGNOSIS — I743 Embolism and thrombosis of arteries of the lower extremities: Secondary | ICD-10-CM | POA: Diagnosis present

## 2018-03-23 DIAGNOSIS — Z931 Gastrostomy status: Secondary | ICD-10-CM

## 2018-03-23 DIAGNOSIS — Z515 Encounter for palliative care: Secondary | ICD-10-CM | POA: Diagnosis present

## 2018-03-23 DIAGNOSIS — N189 Chronic kidney disease, unspecified: Secondary | ICD-10-CM | POA: Diagnosis present

## 2018-03-23 DIAGNOSIS — I69320 Aphasia following cerebral infarction: Secondary | ICD-10-CM | POA: Diagnosis not present

## 2018-03-23 DIAGNOSIS — I129 Hypertensive chronic kidney disease with stage 1 through stage 4 chronic kidney disease, or unspecified chronic kidney disease: Secondary | ICD-10-CM | POA: Diagnosis present

## 2018-03-23 DIAGNOSIS — R059 Cough, unspecified: Secondary | ICD-10-CM

## 2018-03-23 DIAGNOSIS — Z7982 Long term (current) use of aspirin: Secondary | ICD-10-CM

## 2018-03-23 DIAGNOSIS — Z87891 Personal history of nicotine dependence: Secondary | ICD-10-CM

## 2018-03-23 DIAGNOSIS — R05 Cough: Secondary | ICD-10-CM

## 2018-03-23 DIAGNOSIS — M79605 Pain in left leg: Secondary | ICD-10-CM | POA: Diagnosis present

## 2018-03-23 DIAGNOSIS — K625 Hemorrhage of anus and rectum: Secondary | ICD-10-CM | POA: Diagnosis not present

## 2018-03-23 DIAGNOSIS — Z79899 Other long term (current) drug therapy: Secondary | ICD-10-CM

## 2018-03-23 HISTORY — PX: EMBOLECTOMY: SHX44

## 2018-03-23 HISTORY — DX: Vascular dementia, unspecified severity, without behavioral disturbance, psychotic disturbance, mood disturbance, and anxiety: F01.50

## 2018-03-23 LAB — CBC
HEMATOCRIT: 46.1 % (ref 40.0–52.0)
HEMOGLOBIN: 14.9 g/dL (ref 13.0–18.0)
MCH: 33.4 pg (ref 26.0–34.0)
MCHC: 32.3 g/dL (ref 32.0–36.0)
MCV: 103.5 fL — AB (ref 80.0–100.0)
Platelets: 118 10*3/uL — ABNORMAL LOW (ref 150–440)
RBC: 4.45 MIL/uL (ref 4.40–5.90)
RDW: 14.4 % (ref 11.5–14.5)
WBC: 17 10*3/uL — ABNORMAL HIGH (ref 3.8–10.6)

## 2018-03-23 LAB — URINALYSIS, ROUTINE W REFLEX MICROSCOPIC
BILIRUBIN URINE: NEGATIVE
Glucose, UA: NEGATIVE mg/dL
KETONES UR: NEGATIVE mg/dL
Leukocytes, UA: NEGATIVE
NITRITE: NEGATIVE
PH: 5 (ref 5.0–8.0)
Protein, ur: 100 mg/dL — AB
Specific Gravity, Urine: 1.044 — ABNORMAL HIGH (ref 1.005–1.030)

## 2018-03-23 LAB — CBC WITH DIFFERENTIAL/PLATELET
Basophils Absolute: 0 10*3/uL (ref 0–0.1)
Basophils Relative: 0 %
EOS ABS: 0 10*3/uL (ref 0–0.7)
EOS PCT: 0 %
HCT: 57.1 % — ABNORMAL HIGH (ref 40.0–52.0)
Hemoglobin: 18.9 g/dL — ABNORMAL HIGH (ref 13.0–18.0)
LYMPHS ABS: 1.6 10*3/uL (ref 1.0–3.6)
LYMPHS PCT: 8 %
MCH: 34.4 pg — AB (ref 26.0–34.0)
MCHC: 33.1 g/dL (ref 32.0–36.0)
MCV: 103.9 fL — AB (ref 80.0–100.0)
MONO ABS: 1.5 10*3/uL — AB (ref 0.2–1.0)
MONOS PCT: 7 %
Neutro Abs: 18.5 10*3/uL — ABNORMAL HIGH (ref 1.4–6.5)
Neutrophils Relative %: 85 %
PLATELETS: 185 10*3/uL (ref 150–440)
RBC: 5.5 MIL/uL (ref 4.40–5.90)
RDW: 14.3 % (ref 11.5–14.5)
WBC: 21.6 10*3/uL — AB (ref 3.8–10.6)

## 2018-03-23 LAB — COMPREHENSIVE METABOLIC PANEL
ALK PHOS: 74 U/L (ref 38–126)
ALT: 30 U/L (ref 0–44)
AST: 65 U/L — ABNORMAL HIGH (ref 15–41)
Albumin: 2.9 g/dL — ABNORMAL LOW (ref 3.5–5.0)
Anion gap: 13 (ref 5–15)
BILIRUBIN TOTAL: 0.9 mg/dL (ref 0.3–1.2)
BUN: 112 mg/dL — AB (ref 8–23)
CALCIUM: 9 mg/dL (ref 8.9–10.3)
CO2: 20 mmol/L — ABNORMAL LOW (ref 22–32)
CREATININE: 6.21 mg/dL — AB (ref 0.61–1.24)
Chloride: 128 mmol/L — ABNORMAL HIGH (ref 98–111)
GFR calc non Af Amer: 9 mL/min — ABNORMAL LOW (ref >60)
GFR, EST AFRICAN AMERICAN: 10 mL/min — AB (ref >60)
Glucose, Bld: 160 mg/dL — ABNORMAL HIGH (ref 70–99)
Potassium: 4.2 mmol/L (ref 3.5–5.1)
Sodium: 161 mmol/L (ref 135–145)
Total Protein: 7.2 g/dL (ref 6.5–8.1)

## 2018-03-23 LAB — BASIC METABOLIC PANEL
Anion gap: 17 — ABNORMAL HIGH (ref 5–15)
BUN: 111 mg/dL — ABNORMAL HIGH (ref 8–23)
CALCIUM: 10 mg/dL (ref 8.9–10.3)
CO2: 18 mmol/L — AB (ref 22–32)
Chloride: 127 mmol/L — ABNORMAL HIGH (ref 98–111)
Creatinine, Ser: 6.67 mg/dL — ABNORMAL HIGH (ref 0.61–1.24)
GFR calc Af Amer: 9 mL/min — ABNORMAL LOW (ref >60)
GFR calc non Af Amer: 8 mL/min — ABNORMAL LOW (ref >60)
GLUCOSE: 133 mg/dL — AB (ref 70–99)
Potassium: 5.1 mmol/L (ref 3.5–5.1)
Sodium: 162 mmol/L (ref 135–145)

## 2018-03-23 LAB — TYPE AND SCREEN
ABO/RH(D): A NEG
ANTIBODY SCREEN: NEGATIVE

## 2018-03-23 LAB — APTT: APTT: 26 s (ref 24–36)

## 2018-03-23 LAB — TROPONIN I: Troponin I: 0.12 ng/mL (ref <0.03)

## 2018-03-23 LAB — PROTIME-INR
INR: 1.31
INR: 1.83
Prothrombin Time: 16.2 seconds — ABNORMAL HIGH (ref 11.4–15.2)
Prothrombin Time: 21 seconds — ABNORMAL HIGH (ref 11.4–15.2)

## 2018-03-23 LAB — CK: CK TOTAL: 443 U/L — AB (ref 49–397)

## 2018-03-23 SURGERY — EMBOLECTOMY
Anesthesia: General | Site: Leg Lower | Laterality: Left | Wound class: Clean

## 2018-03-23 SURGERY — THROMBECTOMY, ARTERY, FEMORAL
Anesthesia: General | Laterality: Left

## 2018-03-23 MED ORDER — SODIUM CHLORIDE FLUSH 0.9 % IV SOLN
INTRAVENOUS | Status: AC
  Filled 2018-03-23: qty 10

## 2018-03-23 MED ORDER — SODIUM CHLORIDE 0.9 % IV SOLN
INTRAVENOUS | Status: DC
  Administered 2018-03-23: 22:00:00 via INTRAVENOUS

## 2018-03-23 MED ORDER — FENTANYL CITRATE (PF) 100 MCG/2ML IJ SOLN
INTRAMUSCULAR | Status: AC
  Administered 2018-03-23: 25 ug via INTRAVENOUS
  Filled 2018-03-23: qty 2

## 2018-03-23 MED ORDER — LACTATED RINGERS IV SOLN
INTRAVENOUS | Status: DC | PRN
  Administered 2018-03-23 (×2): via INTRAVENOUS

## 2018-03-23 MED ORDER — LIDOCAINE HCL (PF) 2 % IJ SOLN
INTRAMUSCULAR | Status: AC
  Filled 2018-03-23: qty 10

## 2018-03-23 MED ORDER — HEPARIN BOLUS VIA INFUSION
3000.0000 [IU] | Freq: Once | INTRAVENOUS | Status: AC
  Administered 2018-03-23: 3000 [IU] via INTRAVENOUS
  Filled 2018-03-23: qty 3000

## 2018-03-23 MED ORDER — MIDAZOLAM HCL 2 MG/2ML IJ SOLN
INTRAMUSCULAR | Status: AC
  Filled 2018-03-23: qty 2

## 2018-03-23 MED ORDER — ONDANSETRON HCL 4 MG/2ML IJ SOLN
INTRAMUSCULAR | Status: AC
  Administered 2018-03-23: 4 mg via INTRAVENOUS
  Filled 2018-03-23: qty 2

## 2018-03-23 MED ORDER — ROCURONIUM BROMIDE 100 MG/10ML IV SOLN
INTRAVENOUS | Status: DC | PRN
  Administered 2018-03-23: 25 mg via INTRAVENOUS

## 2018-03-23 MED ORDER — IOPAMIDOL (ISOVUE-370) INJECTION 76%
100.0000 mL | Freq: Once | INTRAVENOUS | Status: AC | PRN
  Administered 2018-03-23: 100 mL via INTRAVENOUS

## 2018-03-23 MED ORDER — PROPOFOL 10 MG/ML IV BOLUS
INTRAVENOUS | Status: AC
  Filled 2018-03-23: qty 20

## 2018-03-23 MED ORDER — HEPARIN SODIUM (PORCINE) 1000 UNIT/ML IJ SOLN
INTRAMUSCULAR | Status: DC | PRN
  Administered 2018-03-23: 3000 [IU] via INTRAVENOUS

## 2018-03-23 MED ORDER — ONDANSETRON HCL 4 MG/2ML IJ SOLN
4.0000 mg | Freq: Four times a day (QID) | INTRAMUSCULAR | Status: DC | PRN
  Administered 2018-03-23: 4 mg via INTRAVENOUS

## 2018-03-23 MED ORDER — SUGAMMADEX SODIUM 200 MG/2ML IV SOLN
INTRAVENOUS | Status: DC | PRN
  Administered 2018-03-23: 200 mg via INTRAVENOUS

## 2018-03-23 MED ORDER — SODIUM CHLORIDE 0.9 % IV SOLN
INTRAVENOUS | Status: DC | PRN
  Administered 2018-03-23: 19:00:00 via INTRAMUSCULAR

## 2018-03-23 MED ORDER — SODIUM CHLORIDE FLUSH 0.9 % IV SOLN
INTRAVENOUS | Status: AC
  Filled 2018-03-23: qty 6

## 2018-03-23 MED ORDER — HEPARIN (PORCINE) IN NACL 100-0.45 UNIT/ML-% IJ SOLN
850.0000 [IU]/h | INTRAMUSCULAR | Status: DC
  Administered 2018-03-23: 850 [IU]/h via INTRAVENOUS
  Filled 2018-03-23: qty 250

## 2018-03-23 MED ORDER — SUGAMMADEX SODIUM 200 MG/2ML IV SOLN
INTRAVENOUS | Status: AC
  Filled 2018-03-23: qty 2

## 2018-03-23 MED ORDER — PROPOFOL 10 MG/ML IV BOLUS
INTRAVENOUS | Status: DC | PRN
  Administered 2018-03-23: 60 mg via INTRAVENOUS

## 2018-03-23 MED ORDER — PHENYLEPHRINE HCL 10 MG/ML IJ SOLN
INTRAMUSCULAR | Status: DC | PRN
  Administered 2018-03-23 (×2): 200 ug via INTRAVENOUS

## 2018-03-23 MED ORDER — CEFAZOLIN SODIUM-DEXTROSE 1-4 GM/50ML-% IV SOLN
INTRAVENOUS | Status: DC | PRN
  Administered 2018-03-23: 1 g via INTRAVENOUS

## 2018-03-23 MED ORDER — FENTANYL CITRATE (PF) 100 MCG/2ML IJ SOLN
INTRAMUSCULAR | Status: DC | PRN
  Administered 2018-03-23: 50 ug via INTRAVENOUS

## 2018-03-23 MED ORDER — VASOPRESSIN 20 UNIT/ML IV SOLN
INTRAVENOUS | Status: AC
  Filled 2018-03-23: qty 1

## 2018-03-23 MED ORDER — SUCCINYLCHOLINE CHLORIDE 20 MG/ML IJ SOLN
INTRAMUSCULAR | Status: DC | PRN
  Administered 2018-03-23: 60 mg via INTRAVENOUS

## 2018-03-23 MED ORDER — HEPARIN SODIUM (PORCINE) 1000 UNIT/ML IJ SOLN
INTRAMUSCULAR | Status: AC
  Filled 2018-03-23: qty 1

## 2018-03-23 MED ORDER — LIDOCAINE HCL (CARDIAC) PF 100 MG/5ML IV SOSY
PREFILLED_SYRINGE | INTRAVENOUS | Status: DC | PRN
  Administered 2018-03-23: 60 mg via INTRAVENOUS

## 2018-03-23 MED ORDER — VASOPRESSIN 20 UNIT/ML IV SOLN
INTRAVENOUS | Status: DC | PRN
  Administered 2018-03-23: 2 [IU] via INTRAVENOUS
  Administered 2018-03-23 (×2): 1 [IU] via INTRAVENOUS

## 2018-03-23 MED ORDER — FENTANYL CITRATE (PF) 100 MCG/2ML IJ SOLN
INTRAMUSCULAR | Status: AC
  Filled 2018-03-23: qty 2

## 2018-03-23 MED ORDER — HEPARIN SODIUM (PORCINE) 5000 UNIT/ML IJ SOLN
INTRAMUSCULAR | Status: AC
  Filled 2018-03-23: qty 1

## 2018-03-23 MED ORDER — HEPARIN (PORCINE) IN NACL 100-0.45 UNIT/ML-% IJ SOLN: 500.0000 [IU]/h | INTRAMUSCULAR | Status: DC

## 2018-03-23 MED ORDER — CEFAZOLIN SODIUM 1 G IJ SOLR
INTRAMUSCULAR | Status: AC
  Filled 2018-03-23: qty 10

## 2018-03-23 MED ORDER — ONDANSETRON HCL 4 MG/2ML IJ SOLN
INTRAMUSCULAR | Status: DC | PRN
  Administered 2018-03-23: 4 mg via INTRAVENOUS

## 2018-03-23 MED ORDER — FENTANYL CITRATE (PF) 100 MCG/2ML IJ SOLN
25.0000 ug | INTRAMUSCULAR | Status: DC | PRN
  Administered 2018-03-23 (×2): 25 ug via INTRAVENOUS

## 2018-03-23 MED ORDER — DEXTROSE IN LACTATED RINGERS 5 % IV SOLN
INTRAVENOUS | Status: DC
  Administered 2018-03-24: 07:00:00 via INTRAVENOUS

## 2018-03-23 SURGICAL SUPPLY — 80 items
APPLIER CLIP 11 MED OPEN (CLIP)
APPLIER CLIP 13 LRG OPEN (CLIP)
APPLIER CLIP 9.375 SM OPEN (CLIP)
BAG COUNTER SPONGE EZ (MISCELLANEOUS) ×2 IMPLANT
BAG DECANTER FOR FLEXI CONT (MISCELLANEOUS) ×3 IMPLANT
BAG ISOLATATION DRAPE 20X20 ST (DRAPES) ×1 IMPLANT
BLADE SURG 15 STRL LF DISP TIS (BLADE) ×1 IMPLANT
BLADE SURG 15 STRL SS (BLADE) ×2
BLADE SURG SZ11 CARB STEEL (BLADE) ×3 IMPLANT
BOOT SUTURE AID YELLOW STND (SUTURE) ×3 IMPLANT
BRUSH SCRUB EZ  4% CHG (MISCELLANEOUS) ×2
BRUSH SCRUB EZ 4% CHG (MISCELLANEOUS) ×1 IMPLANT
CANISTER SUCT 1200ML W/VALVE (MISCELLANEOUS) ×3 IMPLANT
CATH EMBL 80X6FR 13.5 STRL (CATHETERS) ×1 IMPLANT
CATH EMBL 80X6FR 13.5STRL (CATHETERS) ×1
CATH EMBOLECTOMY 2X60 (CATHETERS) ×3 IMPLANT
CATH EMBOLECTOMY 3X80 (CATHETERS) ×3 IMPLANT
CATH EMBOLECTOMY 5X80 (CATHETERS) ×3 IMPLANT
CATH EMBOLECTOMY 6X80 (CATHETERS) ×2
CATH FOGERTY 4X80 WAS (CATHETERS) ×3 IMPLANT
CLIP APPLIE 11 MED OPEN (CLIP) IMPLANT
CLIP APPLIE 13 LRG OPEN (CLIP) IMPLANT
CLIP APPLIE 9.375 SM OPEN (CLIP) IMPLANT
COUNTER SPONGE BAG EZ (MISCELLANEOUS) ×1
DERMABOND ADVANCED (GAUZE/BANDAGES/DRESSINGS) ×2
DERMABOND ADVANCED .7 DNX12 (GAUZE/BANDAGES/DRESSINGS) ×1 IMPLANT
DRAPE INCISE IOBAN 66X45 STRL (DRAPES) ×3 IMPLANT
DRAPE ISOLATE BAG 20X20 STRL (DRAPES) ×2
DRSG OPSITE POSTOP 4X6 (GAUZE/BANDAGES/DRESSINGS) ×3 IMPLANT
DURAPREP 26ML APPLICATOR (WOUND CARE) ×3 IMPLANT
ELECT CAUTERY BLADE 6.4 (BLADE) ×3 IMPLANT
ELECT REM PT RETURN 9FT ADLT (ELECTROSURGICAL) ×3
ELECTRODE REM PT RTRN 9FT ADLT (ELECTROSURGICAL) ×1 IMPLANT
GEL ULTRASOUND 20GR AQUASONIC (MISCELLANEOUS) ×9 IMPLANT
GLOVE BIO SURGEON STRL SZ7 (GLOVE) ×6 IMPLANT
GLOVE INDICATOR 7.5 STRL GRN (GLOVE) ×3 IMPLANT
GOWN STRL REUS W/ TWL LRG LVL3 (GOWN DISPOSABLE) ×2 IMPLANT
GOWN STRL REUS W/ TWL XL LVL3 (GOWN DISPOSABLE) ×1 IMPLANT
GOWN STRL REUS W/TWL LRG LVL3 (GOWN DISPOSABLE) ×4
GOWN STRL REUS W/TWL XL LVL3 (GOWN DISPOSABLE) ×2
HEMOSTAT SURGICEL 2X3 (HEMOSTASIS) ×3 IMPLANT
IV NS 500ML (IV SOLUTION) ×2
IV NS 500ML BAXH (IV SOLUTION) ×1 IMPLANT
KIT CATH CVC 3 LUMEN 7FR 8IN (MISCELLANEOUS) ×3 IMPLANT
KIT TURNOVER KIT A (KITS) ×3 IMPLANT
LABEL OR SOLS (LABEL) ×3 IMPLANT
LOOP RED MAXI  1X406MM (MISCELLANEOUS) ×2
LOOP VESSEL MAXI 1X406 RED (MISCELLANEOUS) ×1 IMPLANT
LOOP VESSEL MINI 0.8X406 BLUE (MISCELLANEOUS) ×2 IMPLANT
LOOPS BLUE MINI 0.8X406MM (MISCELLANEOUS) ×4
MICROPUNCTURE 5FR NT-SST (MISCELLANEOUS) IMPLANT
NS IRRIG 500ML POUR BTL (IV SOLUTION) ×3 IMPLANT
PACK BASIN MAJOR ARMC (MISCELLANEOUS) ×3 IMPLANT
PACK UNIVERSAL (MISCELLANEOUS) ×3 IMPLANT
PAD PREP 24X41 OB/GYN DISP (PERSONAL CARE ITEMS) ×3 IMPLANT
PENCIL ELECTRO HAND CTR (MISCELLANEOUS) ×3 IMPLANT
SUCTION FRAZIER HANDLE 10FR (MISCELLANEOUS) ×2
SUCTION TUBE FRAZIER 10FR DISP (MISCELLANEOUS) ×1 IMPLANT
SUT MNCRL 4-0 (SUTURE) ×2
SUT MNCRL 4-0 27XMFL (SUTURE) ×1
SUT PROLENE 5-0 (SUTURE) ×2
SUT PROLENE 5-0 BB 36X2 ARM (SUTURE) ×1
SUT PROLENE 6 0 BV (SUTURE) ×6 IMPLANT
SUT PROLENE 7 0 BV 1 (SUTURE) ×6 IMPLANT
SUT SILK 2 0 (SUTURE) ×2
SUT SILK 2-0 18XBRD TIE 12 (SUTURE) ×1 IMPLANT
SUT SILK 3 0 (SUTURE) ×2
SUT SILK 3-0 18XBRD TIE 12 (SUTURE) ×1 IMPLANT
SUT SILK 4 0 (SUTURE) ×2
SUT SILK 4-0 18XBRD TIE 12 (SUTURE) ×1 IMPLANT
SUT VIC AB 2-0 CT1 27 (SUTURE) ×4
SUT VIC AB 2-0 CT1 TAPERPNT 27 (SUTURE) ×2 IMPLANT
SUT VIC AB 3-0 SH 27 (SUTURE)
SUT VIC AB 3-0 SH 27X BRD (SUTURE) IMPLANT
SUT VICRYL+ 3-0 36IN CT-1 (SUTURE) ×6 IMPLANT
SUTURE MNCRL 4-0 27XMF (SUTURE) ×1 IMPLANT
SUTURE PROLEN 5-0 BB 36X2 ARM (SUTURE) ×1 IMPLANT
SYR 20CC LL (SYRINGE) ×3 IMPLANT
SYR 5ML LL (SYRINGE) ×6 IMPLANT
TRAY FOLEY MTR SLVR 16FR STAT (SET/KITS/TRAYS/PACK) ×3 IMPLANT

## 2018-03-23 NOTE — Consult Note (Addendum)
ANTICOAGULATION CONSULT NOTE - Initial Consult  Pharmacy Consult for Heparin Drip  Indication: arterial clot  No Known Allergies  Patient Measurements: Height: 5\' 8"  (172.7 cm) Weight: 112 lb (50.8 kg) IBW/kg (Calculated) : 68.4 Vital Signs: Temp: 96.4 F (35.8 C) (07/27 2015) Temp Source: Temporal (07/27 2015) BP: 102/73 (07/27 2050) Pulse Rate: 99 (07/27 2055)  Labs: Recent Labs    03/18/2018 1627 03/04/2018 1820  HGB 18.9*  --   HCT 57.1*  --   PLT 185  --   APTT 26  --   LABPROT 16.2*  --   INR 1.31  --   CREATININE  --  6.67*    Estimated Creatinine Clearance: 8.3 mL/min (A) (by C-G formula based on SCr of 6.67 mg/dL (H)).   Medical History: Past Medical History:  Diagnosis Date  . Aphasia   . Dysphagia   . Hypertension   . Stroke (HCC)   . Vascular dementia     Assessment: Pharmacy consulted for heparin dosing and monitoring in 62 yo male with arterial clot  Goal of Therapy:  Heparin level 0.3-0.7 units/ml Monitor platelets by anticoagulation protocol: Yes   Plan:  Baseline labs ordered Give 3000 units bolus x 1 Start heparin infusion at 850 units/hr Check anti-Xa level in 6 hours and daily while on heparin Continue to monitor H&H and platelets   7/27 @ 2020 Pharmacy received consult from Dr. Evie LacksEsco for heparin dosing change. Per Dr. Evie LacksEsco Heparin drip to start and MAINTAIN at a rate of 0200 at a rate of 500units/hr with NO bolus. Will order heparin level 6 hours after infusion starts. CBC with AM labs per protocol.   Gardner CandleSheema M Elfie Costanza, PharmD, BCPS Clinical Pharmacist 02/25/2018 9:02 PM

## 2018-03-23 NOTE — Anesthesia Post-op Follow-up Note (Signed)
Anesthesia QCDR form completed.        

## 2018-03-23 NOTE — Consult Note (Signed)
ANTICOAGULATION CONSULT NOTE - Initial Consult  Pharmacy Consult for Heparin Drip  Indication: arterial clot  No Known Allergies  Patient Measurements: Height: 5\' 8"  (172.7 cm) Weight: 112 lb (50.8 kg) IBW/kg (Calculated) : 68.4 Vital Signs: BP: 124/73 (07/27 1610) Pulse Rate: 117 (07/27 1625)  Labs: No results for input(s): HGB, HCT, PLT, APTT, LABPROT, INR, HEPARINUNFRC, HEPRLOWMOCWT, CREATININE, CKTOTAL, CKMB, TROPONINI in the last 72 hours.  CrCl cannot be calculated (Patient's most recent lab result is older than the maximum 21 days allowed.).   Medical History: Past Medical History:  Diagnosis Date  . Aphasia   . Dysphagia   . Hypertension   . Stroke [facility](HCC)     Assessment: Pharmacy consulted for heparin dosing and monitoring in 62 yo male with arterial clot  Goal of Therapy:  Heparin level 0.3-0.7 units/ml Monitor platelets by anticoagulation protocol: Yes   Plan:  Baseline labs ordered Give 3000 units bolus x 1 Start heparin infusion at 850 units/hr Check anti-Xa level in 6 hours and daily while on heparin Continue to monitor H&H and platelets  Gardner CandleSheema M Cheng Dec, PharmD, BCPS Clinical Pharmacist 03/10/2018 4:51 PM

## 2018-03-23 NOTE — Op Note (Signed)
Surgeon: Eli HoseEsco, [place] Stegemann A   Assistants: None  Anesthesia: General endotracheal anesthesia  PreOp: Ischemic Left Lower extremity  Post ZO:[id number]:Ischemic Left Lower extremity  Procedure: LEFT Iliofemoral thromboembolectomy  Scription of procedure: After informed consent was obtained the patient was taken to the operating room laid supine operating table.  Bilateral groins  And the the left lower extremity were prepped and draped in usual sterile fashion.  Patient received preoperative antibiotics.  And a timeout was performed.  A 3 cm longitudinal incision was made in the left groin and using electrocautery Metzenbaum scissors the common femoral artery was dissected out and encircled using vessel loops.  A transfeverse arteriotomy was made in the common femoral artery using  #11 blade and Potts. Then use a #3 and 4 Fogarty balloon proximally to approximately 60 cm while holding pressure occluding the right common femoral artery and several passes were performed retrieving significant amount of thrombus.  After 3 clean passes I had excellent pulsatile inflow.  This is then flushed with heparinized saline.  I then turned my attention distally and using a #2 #3 Fogarty balloon this was carried to approximately 70 cm.  Minimal thrombus was retrieved after numerous passes.  This is then flushed with heparinized saline and the arteriotomy was closed using a 5-0 Prolene suture.  This is flushed proximally and distally and with heparinized saline before closing the arteriotomy. .  There is excellent pulsatile flow  distal to the common femoral artery and the superficial femoral artery.  The wound is copiously irrigated with antibiotic irrigation and the wound was closed in layers using a 2-0, 3- 0 Vicryl and the skin was closed with 4-0 Monocryl.  Of note the patient did receive 3000 units of heparin in addition to the heparin drip he was previously on.  Conclusion of the procedure his foot and leg were much warmer pink  and viable.

## 2018-03-23 NOTE — H&P (Signed)
Willie Waller is an 62 y.o. male.   Chief Complaint: Ischemic Left limb HPI: Patient from nursing home, history of CVA, nonverbal, minimally reactive. Noted to have a cool mottled LEFT lower extremity- timing unclear.  Past Medical History:  Diagnosis Date  . Aphasia   . Dysphagia   . Hypertension   . Stroke [facility])     Past Surgical History:  Procedure Laterality Date  . PEG PLACEMENT      History reviewed. No pertinent family history. Social History:  reports that he has never smoked. He has never used smokeless tobacco. He reports that he does not drink alcohol or use drugs.  Allergies: No Known Allergies   (Not in a hospital admission)  Results for orders placed or performed during the hospital encounter of Apr 04, 2018 (from the past 48 hour(s))  CBC with Differential     Status: Abnormal   Collection Time: Apr 04, 2018  4:27 PM  Result Value Ref Range   WBC 21.6 (H) 3.8 - 10.6 K/uL   RBC 5.50 4.40 - 5.90 MIL/uL   Hemoglobin 18.9 (H) 13.0 - 18.0 g/dL   HCT 16.1 (H) 09.6 - 04.5 %   MCV 103.9 (H) 80.0 - 100.0 fL   MCH 34.4 (H) 26.0 - 34.0 pg   MCHC 33.1 32.0 - 36.0 g/dL   RDW 40.9 81.1 - 91.4 %   Platelets 185 150 - 440 K/uL    Comment: PLATELET COUNT CONFIRMED BY SMEAR   Neutrophils Relative % 85 %   Neutro Abs 18.5 (H) 1.4 - 6.5 K/uL   Lymphocytes Relative 8 %   Lymphs Abs 1.6 1.0 - 3.6 K/uL   Monocytes Relative 7 %   Monocytes Absolute 1.5 (H) 0.2 - 1.0 K/uL   Eosinophils Relative 0 %   Eosinophils Absolute 0.0 0 - 0.7 K/uL   Basophils Relative 0 %   Basophils Absolute 0.0 0 - 0.1 K/uL    Comment: Performed at [facility], [address] [place], [place] [address]  APTT     Status: None   Collection Time: 2018-04-04  4:27 PM  Result Value Ref Range   aPTT 26 24 - 36 seconds    Comment: Performed at [facility], [address] [place], [place] [address]  Protime-INR     Status: Abnormal   Collection Time: 04/04/2018  4:27 PM  Result Value Ref  Range   Prothrombin Time 16.2 (H) 11.4 - 15.2 seconds   INR 1.31     Comment: Performed at [facility], [address], [place], [place] [address]   No results found.  Review of Systems  Unable to perform ROS: Patient nonverbal    Blood pressure 124/73, pulse (!) 117, resp. rate (!) 37, height 5\' 8"  (1.727 m), weight 50.8 kg (112 lb), SpO2 95 %. Physical Exam  Constitutional:  Malnourished, cachectic  Cardiovascular: Normal rate.  LEFT Leg: No pulses from Femoral-distal, cool, mottled  Respiratory: Effort normal.  GI: Soft. He exhibits no distension. There is no rebound.  Musculoskeletal:  Threatened left limb, minimally viable, Cool, mottled, acutely ischemic.  Neurological:  Noverbal, minimally reactive but moving around in the bed  Skin: No pallor.     Assessment/Plan Acutely Ischemic and threatened LEFT lower extremity. Minimally viable. CTA reviewed- partial Aortic thrombus, Occluding LEFT Common Iliac, External Iliac and Common femoral Thombus noted. No opacification beyond femoral artery  Will take emergently to OR for Thromboembolectomy. It is difficult to ascertain the timing of the patients  ischemia, patient nonverbal and does not respond to touch of the limb.  Discussed procedure, risks, benefits with daughter- understanding expressed-consent obtained  Will obtain Medicine consult to assist in Medical management. ECHO Heparin gtt.  Bertram DenverEsco, Marcus Groll A, MD 08/13/2018, 5:48 PM

## 2018-03-23 NOTE — Anesthesia Preprocedure Evaluation (Addendum)
Anesthesia Evaluation  Patient identified by MRN, date of birth, ID band Patient confused    Reviewed: Allergy & Precautions, H&P , NPO status , Patient's Chart, lab work & pertinent test results  History of Anesthesia Complications Negative for: history of anesthetic complications  Airway Mallampati: III  TM Distance: >3 FB Neck ROM: full    Dental   Pulmonary neg pulmonary ROS, neg shortness of breath,           Cardiovascular hypertension, (-) angina+ Past MI  (-) DOE      Neuro/Psych CVA, Residual Symptoms negative psych ROS   GI/Hepatic negative GI ROS, Neg liver ROS,   Endo/Other  negative endocrine ROS  Renal/GU      Musculoskeletal   Abdominal   Peds  Hematology negative hematology ROS (+)   Anesthesia Other Findings Past Medical History: No date: Aphasia No date: Dysphagia No date: Hypertension No date: Stroke [facility](HCC)  Past Surgical History: No date: PEG PLACEMENT  BMI    Body Mass Index:  17.03 kg/m      Reproductive/Obstetrics negative OB ROS                            Anesthesia Physical Anesthesia Plan  ASA: IV and emergent  Anesthesia Plan: General ETT, Cricoid Pressure and Rapid Sequence   Post-op Pain Management:    Induction: Intravenous  PONV Risk Score and Plan: Ondansetron and Dexamethasone  Airway Management Planned: Oral ETT  Additional Equipment:   Intra-op Plan:   Post-operative Plan: Extubation in OR and Possible Post-op intubation/ventilation  Informed Consent: I have reviewed the patients History and Physical, chart, labs and discussed the procedure including the risks, benefits and alternatives for the proposed anesthesia with the patient or authorized representative who has indicated his/her understanding and acceptance.   Dental Advisory Given  Plan Discussed with: Anesthesiologist, CRNA and Surgeon  Anesthesia Plan Comments: (Phone  consent from daughter Raymondo BandKellin Hitbold at [phone]  Plan to suspend DNR for procedure.  Daughter voiced understanding.   Daughter consented for risks of anesthesia including but not limited to:  - adverse reactions to medications - damage to teeth, lips or other oral mucosa - sore throat or hoarseness - Damage to heart, brain, lungs or loss of life  Daughter voiced understanding.)        Anesthesia Quick Evaluation

## 2018-03-23 NOTE — ED Provider Notes (Addendum)
[facility] Emergency Department Provider Note   ____________________________________________   I have reviewed the triage vital signs and the nursing notes.   HISTORY  Chief Complaint Left leg problem  History limited by and level 5 caveat due to: Stroke  HPI Willie Waller is a 62 y.o. male who presents to the emergency department today because of facility staff concern for left leg mottling and coolness. Noticed just today about 30 minutes prior to arrival. The patient himself cannot give any history.   Per medical record review patient has a history of stroke, aphasia, dysphagia.   Past Medical History:  Diagnosis Date  . Aphasia   . Dysphagia   . Hypertension   . Stroke [facility](HCC)     Patient Active Problem List   Diagnosis Date Noted  . Protein-calorie malnutrition, severe 09/12/2016  . Sepsis (HCC) 09/11/2016    Past Surgical History:  Procedure Laterality Date  . PEG PLACEMENT      Prior to Admission medications   Medication Sig Start Date End Date Taking? Authorizing Provider  acetaminophen (TYLENOL) 325 MG tablet Take 650 mg by mouth every 6 (six) hours as needed.    [provider]  amoxicillin-clavulanate (AUGMENTIN) 400-57 MG/5ML suspension Place 10.9 mLs (872 mg total) into feeding tube every 12 (twelve) hours. 09/15/16   Shaune Pollackhen, Qing, MD  aspirin 81 MG chewable tablet Chew by mouth daily.    [provider]  calcium carbonate (OSCAL) 1500 (600 Ca) MG TABS tablet Take by mouth 2 (two) times daily with a meal.    [provider]  divalproex (DEPAKOTE SPRINKLE) 125 MG capsule Take 125 mg by mouth daily.    [provider]  guaiFENesin (ROBITUSSIN) 100 MG/5ML SOLN Take 5 mLs (100 mg total) by mouth every 4 (four) hours as needed for cough or to loosen phlegm. 09/15/16   Shaune Pollackhen, Qing, MD  ketoconazole (NIZORAL) 2 % cream Apply 1 application topically 2 (two) times daily as needed for irritation.    [provider]  levalbuterol (XOPENEX) 1.25 MG/0.5ML nebulizer solution Take 1.25 mg by nebulization every 6 (six) hours as needed for wheezing or shortness of breath. 09/15/16   Shaune Pollackhen, Qing, MD  metoprolol (LOPRESSOR) 100 MG tablet Take 100 mg by mouth 2 (two) times daily.    [provider]  Nutritional Supplements (TWOCAL HN) LIQD Take 237 mLs by mouth 4 (four) times daily.    [provider]  ondansetron (ZOFRAN) 4 MG tablet Take 4 mg by mouth every 6 (six) hours as needed for nausea or vomiting.    [provider]  pantoprazole (PROTONIX) 40 MG tablet Take 40 mg by mouth daily.    [provider]    Allergies Patient has no known allergies.  No family history on file.  Social History Social History   Tobacco Use  . Smoking status: Never Smoker  . Smokeless tobacco: Never Used  Substance Use Topics  . Alcohol use: No  . Drug use: No    Review of Systems Unable to obtain secondary to chronic medical condition.  ____________________________________________   PHYSICAL EXAM:  VITAL SIGNS: ED Triage Vitals  Enc Vitals Group     BP 124/73     Pulse 117     Resp 37     Temp      Temp src      SpO2 95   Constitutional: Awake and alert. Eyes: Conjunctivae are normal.  ENT  Head: Normocephalic and atraumatic.      Nose: No congestion/rhinnorhea.      Mouth/Throat: Mucous membranes are moist.      Neck: No stridor. Hematological/Lymphatic/Immunilogical: No cervical lymphadenopathy. Cardiovascular: Normal rate, regular rhythm.  No murmurs, rubs, or gallops. Respiratory: Normal respiratory effort without tachypnea nor retractions. Breath sounds are clear and equal bilaterally. No wheezes/rales/rhonchi. Gastrointestinal: Soft and non tender. No rebound. No guarding.  Genitourinary: Deferred Musculoskeletal: Left leg with mottling and coolness throughout the leg. Unable to palpate or doppler DP in either leg. Femoral was palpable to the  right leg but not the left leg, nor was femoral pulse dopplerable on the left leg.  Neurologic:  Nonverbal. No gross focal neurologic deficits are appreciated.  Skin:  Mottling of the skin of the left leg.  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  Critical care was necessary to treat or prevent imminent or life-threatening deterioration.  Critical care was time spent personally by me on the following activities: development of treatment plan with patient and/or surrogate as well as nursing, discussions with consultants, evaluation of patient's response to treatment, examination of patient, obtaining history from patient or surrogate, ordering and performing treatments and interventions, ordering and review of laboratory studies, ordering and review of radiographic studies, pulse oximetry and re-evaluation of patient's condition.  ____________________________________________   INITIAL IMPRESSION / ASSESSMENT AND PLAN / ED COURSE  Pertinent labs & imaging results that were available during my care of the patient  were reviewed by me and considered in my medical decision making (see chart for details).   Presented from living facility today because returns for mottled and cold left lower extremity.  On exam both Doppler and femoral pulses were not obtainable in the left leg.  Did call Dr. Evie Lacks with vascular who recommended a CT angios.  CT Angie does show complete occlusion of the left iliac.  Also occlusion of the IMA and stenosis in the right leg.  Patient will be taken to the OR by Dr. Evie Lacks.  Heparin was started.   ____________________________________________   FINAL CLINICAL IMPRESSION(S) / ED DIAGNOSES  Final diagnoses:  Ischemic leg     Note: This dictation was prepared with Dragon dictation. Any transcriptional errors that result from this process are unintentional     Phineas Semen, MD 03/29/18 1755    Phineas Semen, MD 03-29-2018 [phone]

## 2018-03-23 NOTE — ED Triage Notes (Signed)
Pt arrives [facility],  after staff noticed L leg discolored. Leg is cold to touch, modeled. Dr. Derrill KayGoodman bedside.

## 2018-03-23 NOTE — Transfer of Care (Signed)
Immediate Anesthesia Transfer of Care Note  Patient: Willie Waller  Procedure(s) Performed: thromboembolectomy (Left Leg Lower)  Patient Location: PACU  Anesthesia Type:General  Level of Consciousness: awake  Airway & Oxygen Therapy: Patient Spontanous Breathing and Patient connected to nasal cannula oxygen  Post-op Assessment: Report given to RN and Post -op Vital signs reviewed and stable  Post vital signs: Reviewed and stable  Last Vitals:  Vitals Value Taken Time  BP 108/45 02/07/18  8:09 PM  Temp    Pulse    Resp 22 02/07/18  8:11 PM  SpO2    Vitals shown include unvalidated device data.  Last Pain:  Vitals:   Jan 15, 2018 2011  TempSrc: Temporal  PainSc:          Complications: No apparent anesthesia complications

## 2018-03-23 NOTE — Anesthesia Procedure Notes (Signed)
Procedure Name: Intubation Date/Time: 07-30-18 6:33 PM Performed by: Estanislado EmmsShort, Mahamud Metts L, CRNA Pre-anesthesia Checklist: Patient identified, Patient being monitored, Timeout performed, Emergency Drugs available and Suction available Patient Re-evaluated:Patient Re-evaluated prior to induction Oxygen Delivery Method: Circle system utilized Preoxygenation: Pre-oxygenation with 100% oxygen Induction Type: IV induction Ventilation: Mask ventilation without difficulty Laryngoscope Size: Miller and 2 Grade View: Grade I Tube type: Oral Tube size: 7.5 mm Number of attempts: 1 Airway Equipment and Method: Stylet Placement Confirmation: ETT inserted through vocal cords under direct vision,  positive ETCO2 and breath sounds checked- equal and bilateral Secured at: 21 cm Tube secured with: Tape Dental Injury: Teeth and Oropharynx as per pre-operative assessment  Comments: Copious amount of sticky yellow secretions suctioned from post OP.  Film of same secretions covering glottic opening.

## 2018-03-23 NOTE — Consult Note (Signed)
[facility] [place] [facility]   PATIENT NAME: Willie Waller    MR#:  [id number]  DATE OF BIRTH:  August 31, 1955  DATE OF ADMISSION:  03/27/2018  PRIMARY CARE PHYSICIAN: System, Pcp Not In   REQUESTING/REFERRING PHYSICIAN: Dr. Eli Hose  CHIEF COMPLAINT:   Chief Complaint  Patient presents with  . ischemic leg    HISTORY OF PRESENT ILLNESS:  Willie Waller  is a 62 y.o. male with a known history of strokes status post aphasia, history of vascular dementia presents to hospital from [facility] secondary to left leg mottling noted today. Patient is nonverbal, of the history is obtained from his daughter over the phone and also previous records. Patient has had a history of IV drug abuse in the past and had suffered minor cardiac insults and also previous strokes. He has been at the nursing home for almost 8 to 9 years now. Family visits him once or twice a month. He has progressed to the point where he is able to take a few steps with a walker with assistance. Mostly sits in a chair all day. He is nonverbal but sometimes nods his head to questions. No known history of atrial fibrillation. Takes aspirin. Noted to have a cool and mottled left lower extremity today and so sent to the emergency room. CT angiogram consistent with partial aortic thrombus, occluded left common iliac, external iliac and common femoral artery with a thrombus. Patient going to OR for thromboembolectomy. Medical consult requested for medical management.  PAST MEDICAL HISTORY:   Past Medical History:  Diagnosis Date  . Aphasia   . Dysphagia   . Hypertension   . Stroke (HCC)   . Vascular dementia     PAST SURGICAL HISTOIRY:   Past Surgical History:  Procedure Laterality Date  . PEG PLACEMENT      SOCIAL HISTORY:   Social History   Tobacco Use  . Smoking status: Former Games developer  . Smokeless tobacco: Never Used  Substance Use Topics  . Alcohol use: Not Currently     Comment: used to be a heavy alcoholic    FAMILY HISTORY:   Family History  Problem Relation Age of Onset  . Heart failure Mother   . Thyroid cancer Mother   . Suicidality Brother     DRUG ALLERGIES:  No Known Allergies  REVIEW OF SYSTEMS:   Review of Systems  Unable to perform ROS: Patient nonverbal      MEDICATIONS AT HOME:   Prior to Admission medications   Medication Sig Start Date End Date Taking? Authorizing Provider  aspirin 81 MG chewable tablet Chew by mouth daily.   Yes [provider]  calcium carbonate (OSCAL) 1500 (600 Ca) MG TABS tablet Take by mouth 2 (two) times daily with a meal.   Yes [provider]  escitalopram (LEXAPRO) 10 MG tablet Take 10 mg by mouth daily.   Yes [provider]  Eyelid Cleansers (OCUSOFT EYELID CLEANSING) PADS Use 1 wipe on each eyelid daily   Yes [provider]  ketoconazole (NIZORAL) 2 % cream 1 application. (on shower days)   Yes [provider]  loperamide (IMODIUM) 2 MG capsule Take 2 mg by mouth as needed for diarrhea or loose stools. (max 6 doses)   Yes [provider]  metoprolol (LOPRESSOR) 100 MG tablet Take 100 mg by mouth 2 (two) times daily.   Yes [provider]  Nutritional Supplements (TWOCAL HN) LIQD Take 237 mLs  by mouth 4 (four) times daily.   Yes [provider]      VITAL SIGNS:  Blood pressure 124/73, pulse (!) 117, resp. rate (!) 37, height 5\' 8"  (1.727 m), weight 50.8 kg (112 lb), SpO2 95 %.  PHYSICAL EXAMINATION:   Physical Exam  GENERAL:  62 y.o.-year-old patient lying in the bed with no acute distress.  EYES: Pupils equal, round, reactive to light and accommodation. No scleral icterus. Extraocular muscles intact.  HEENT: Head atraumatic, normocephalic. Oropharynx and nasopharynx clear.  NECK:  Supple, no jugular venous distention. No thyroid enlargement, no tenderness.  LUNGS: Normal breath sounds bilaterally, no wheezing,  rales,rhonchi or crepitation. No use of accessory muscles of respiration. Decreased bibasilar breath sounds CARDIOVASCULAR: S1, S2 normal. No murmurs, rubs, or gallops.  ABDOMEN: Soft, nontender, nondistended. Bowel sounds present. No organomegaly or mass. Peg tube in place EXTREMITIES: left leg is cyanotic, mottled and very cold to touch, no palpable DP pulses. Right foot with no edema, palpable dorsalis pedis pulse noted.    NEUROLOGIC: nonverbal, right facial droop noted. Not following commands, but seen moving his RUE and RLE  In bed.  PSYCHIATRIC: The patient is alert, non verbal. SKIN: No obvious rash, lesion, or ulcer.   LABORATORY PANEL:   CBC Recent Labs  Lab 03/22/2018 1627  WBC 21.6*  HGB 18.9*  HCT 57.1*  PLT 185   ------------------------------------------------------------------------------------------------------------------  Chemistries  No results for input(s): NA, K, CL, CO2, GLUCOSE, BUN, CREATININE, CALCIUM, MG, AST, ALT, ALKPHOS, BILITOT in the last 168 hours.  Invalid input(s): GFRCGP ------------------------------------------------------------------------------------------------------------------  Cardiac Enzymes No results for input(s): TROPONINI in the last 168 hours. ------------------------------------------------------------------------------------------------------------------  RADIOLOGY:  Ct Angio Ao+bifem W & Or Wo Contrast  Result Date: 03/16/2018 CLINICAL DATA:  Cold left leg, no pulse EXAM: CT ANGIOGRAPHY OF ABDOMINAL AORTA WITH ILIOFEMORAL RUNOFF TECHNIQUE: Multidetector CT imaging of the abdomen, pelvis and lower extremities was performed using the standard protocol during bolus administration of intravenous contrast. Multiplanar CT image reconstructions and MIPs were obtained to evaluate the vascular anatomy. CONTRAST:  100mL ISOVUE-370 IOPAMIDOL (ISOVUE-370) INJECTION 76% COMPARISON:  CT abdomen pelvis 03/20/2012 FINDINGS: VASCULAR Aorta: Marked  atherosclerotic irregularity with extensive calcified and noncalcified plaque. Extensive irregular plaque in the distal descending thoracic aorta posteriorly. No aneurysm. Celiac: Patent SMA: Patent Renals: Occluded IMA: Heavily calcified bilaterally, patent on the left. Very small right renal artery diffusely. RIGHT Lower Extremity Inflow: Heavily calcified right common iliac artery with severe stenosis, greater than 70%. The external iliac is patent. Severely diseased internal iliac. Outflow: Moderate calcifications in the common femoral artery and throughout the superficial femoral artery. Profundus is patent with proximal moderate disease. Moderate disease in the popliteal artery. Occlusion of the distal popliteal artery. Runoff: Reconstitution of portions of the peroneal and posterior tibial arteries in the calf. Non opacification of the posterior tibial artery at the ankle likely due to proximal disease and slow flow. 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 Outflow: Minimal reconstitution of the common femoral artery likely via circumflex iliac branches. Minimal flow in the proximal superficial femoral artery upper profunda. Complete occlusion of the superficial femoral artery below the proximal portion. Popliteal artery is completely occluded. Runoff: No flow noted within the trifurcation vessels in the left calf. Veins: Grossly unremarkable. Review of the MIP images confirms the above findings. NON-VASCULAR Lower chest: Airspace disease posteriorly in the right lower lobe could reflect atelectasis or pneumonia. Left  lung base clear. No effusions. Heart is normal size. Hepatobiliary: No focal hepatic abnormality. Gallbladder unremarkable. Pancreas: No focal abnormality or ductal dilatation. Spleen: No focal abnormality.  Normal size. Adrenals/Urinary Tract: Areas of poor enhancement within the kidneys bilaterally, likely  related to renovascular disease. Scarring and cortical thinning within the right kidney. No hydronephrosis. Adrenal glands and urinary bladder grossly unremarkable. Stomach/Bowel: Mild gaseous distention of the colon with scattered air-fluid levels which could reflect diarrhea. Appendix is normal. No evidence of bowel obstruction. Stomach and small bowel decompressed, unremarkable. Gastrostomy tube enters the distal stomach. The balloon is noted in the distal stomach or proximal duodenum. Lymphatic: No adenopathy. Reproductive: Prostate enlargement. Other: Hardware noted within the proximal left femur related to remote injury. Degenerative changes in the visualized thoracolumbar spine. Musculoskeletal: IMPRESSION: VASCULAR Severe atherosclerotic disease throughout the aorta. Irregular plaque posteriorly in the distal descending thoracic aorta. Heavily calcified aorta. Heavily calcified mesenteric vessels and renal arteries bilaterally. The right renal artery is severely narrowed diffusely. 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 Minimal reconstitution in the left common femoral artery and profunda with reocclusion of the proximal superficial femoral artery. No flow noted in the left lower extremity below the proximal SFA. Moderate to severe stenosis in the right common iliac artery, greater than 70%. Heavily calcified external iliac and common femoral and superficial femoral vessels. Complete occlusion of the distal right popliteal artery with reconstitution of portions of the peroneal and posterior tibial arteries in the right calf. NON-VASCULAR Mild gaseous distention of the colon with scattered air-fluid levels. This could be seen with diarrhea. Gastrostomy tube in place. The balloon is within the distal stomach or proximal duodenum. Atrophic right kidney. Areas of poor enhancement within the kidneys bilaterally, likely related to renovascular  disease. Electronically Signed   By: Charlett Nose M.D.   On: 03/06/2018 17:46    EKG:   Orders placed or performed during the hospital encounter of 03/09/2018  . EKG 12-Lead  . EKG 12-Lead    IMPRESSION AND PLAN:   Willie Waller  is a 62 y.o. male with a known history of strokes status post aphasia, history of vascular dementia presents to hospital from [facility] secondary to left leg mottling noted today.  1. Ischemic left leg-CT angiogram with aortic thrombus with occluded, iliac, external iliac and common femoral arteries -management per vascular team -IV heparin -for thromboembolectomy today -echocardiogram to rule out LV thrombus -anticoagulation at discharge per vascular team  2. History of CVA-also has vascular dementia. Nonverbal at baseline. -Continue peg feeds, dietitian consulted -physical therapy after his surgery -aspirin can be restarted, also on IV heparin drip  3. Leukocytosis-secondary to ischemic event. Continue to monitor and rule out any infection. Patient is in the operating room. -check chest x-ray to rule out aspiration, also urine analysis to rule out infection. Patient has had history of several aspiration pneumonias.  4. Hypertension-metoprolol can be restarted after surgery  5. Gerd-on Protonix  6. DVT-prophylaxis-on heparin drip   Called daughter Ms. Caryl Asp over the phone at [phone] and updated her. She would like to be updated after surgery.    All the records are reviewed and case discussed with Consulting provider. Management plans discussed with the patient, family and they are in agreement.  CODE STATUS: DNR  TOTAL TIME TAKING CARE OF THIS PATIENT: 50 minutes.    Enid Baas M.D on 03/08/2018 at 6:35 PM  Between 7am to 6pm - Pager -  [phone]  After 6pm go to [email or web address] - password [email or web address]  [facility]  Office  [phone]  CC: Primary care Physician: System, Pcp Not In

## 2018-03-23 NOTE — Anesthesia Postprocedure Evaluation (Signed)
Anesthesia Post Note  Patient: Willie Waller  Procedure(s) Performed: thromboembolectomy (Left Leg Lower)  Patient location during evaluation: PACU Anesthesia Type: General Level of consciousness: awake and alert Pain management: pain level controlled Vital Signs Assessment: post-procedure vital signs reviewed and stable Respiratory status: spontaneous breathing, nonlabored ventilation, respiratory function stable and patient connected to nasal cannula oxygen Cardiovascular status: blood pressure returned to baseline and stable Postop Assessment: no apparent nausea or vomiting Anesthetic complications: no     Last Vitals:  Vitals:   03/26/2018 2145 03/04/2018 2150  BP:  118/77  Pulse: 99 99  Resp: (!) 26 (!) 26  Temp:    SpO2: 95% 95%    Last Pain:  Vitals:   03/04/2018 2120  TempSrc:   PainSc: 0-No pain                 Cleda MccreedyJoseph K Norvell Ureste

## 2018-03-23 NOTE — Anesthesia Procedure Notes (Addendum)
Central Venous Catheter Insertion Performed by: Rosaria FerriesPiscitello, Madia Carvell K, MD, anesthesiologist Start/End07/14/2019 7:30 PM, 03/05/2018 7:40 PM Patient location: OR. Preanesthetic checklist: patient identified, IV checked, site marked, risks and benefits discussed, surgical consent, monitors and equipment checked, pre-op evaluation, timeout performed and anesthesia consent Position: Trendelenburg Hand hygiene performed , maximum sterile barriers used  and Seldinger technique used Catheter size: 7 Fr Total catheter length 20. Central line was placed.Triple lumen Procedure performed using ultrasound guided technique. Ultrasound Notes:anatomy identified Attempts: 1 Following insertion, dressing applied, line sutured and Biopatch. Post procedure assessment: blood return through all ports, free fluid flow and no air  Patient tolerated the procedure well with no immediate complications. Additional procedure comments: REF CDC [id number] LOT [id number] Expiration date 2018-07-27.

## 2018-03-24 ENCOUNTER — Encounter: Payer: Self-pay | Admitting: Anesthesiology

## 2018-03-24 ENCOUNTER — Inpatient Hospital Stay: Payer: Medicaid Other | Admitting: Anesthesiology

## 2018-03-24 ENCOUNTER — Inpatient Hospital Stay: Payer: Medicaid Other

## 2018-03-24 ENCOUNTER — Encounter: Admission: EM | Disposition: E | Payer: Self-pay | Source: Home / Self Care | Attending: Vascular Surgery

## 2018-03-24 ENCOUNTER — Inpatient Hospital Stay
Admit: 2018-03-24 | Discharge: 2018-03-24 | Disposition: A | Discharge status: Home / Self Care | Payer: Medicaid Other | Attending: Vascular Surgery | Admitting: Vascular Surgery

## 2018-03-24 DIAGNOSIS — N179 Acute kidney failure, unspecified: Secondary | ICD-10-CM

## 2018-03-24 DIAGNOSIS — K625 Hemorrhage of anus and rectum: Secondary | ICD-10-CM

## 2018-03-24 DIAGNOSIS — K920 Hematemesis: Secondary | ICD-10-CM

## 2018-03-24 LAB — PHOSPHORUS
PHOSPHORUS: 4.7 mg/dL — AB (ref 2.5–4.6)
Phosphorus: 2.9 mg/dL (ref 2.5–4.6)

## 2018-03-24 LAB — PROTIME-INR
INR: 1.52
PROTHROMBIN TIME: 18.2 s — AB (ref 11.4–15.2)

## 2018-03-24 LAB — COMPREHENSIVE METABOLIC PANEL
ALK PHOS: 65 U/L (ref 38–126)
ALT: 31 U/L (ref 0–44)
AST: 82 U/L — ABNORMAL HIGH (ref 15–41)
Albumin: 2.8 g/dL — ABNORMAL LOW (ref 3.5–5.0)
BILIRUBIN TOTAL: 0.7 mg/dL (ref 0.3–1.2)
BUN: 112 mg/dL — ABNORMAL HIGH (ref 8–23)
CALCIUM: 8.5 mg/dL — AB (ref 8.9–10.3)
CO2: 20 mmol/L — AB (ref 22–32)
CREATININE: 7.16 mg/dL — AB (ref 0.61–1.24)
Chloride: >130 mmol/L (ref 98–111)
GFR calc Af Amer: 8 mL/min — ABNORMAL LOW (ref >60)
GFR, EST NON AFRICAN AMERICAN: 7 mL/min — AB (ref >60)
Glucose, Bld: 144 mg/dL — ABNORMAL HIGH (ref 70–99)
Potassium: 4.6 mmol/L (ref 3.5–5.1)
Sodium: 161 mmol/L (ref 135–145)
TOTAL PROTEIN: 7 g/dL (ref 6.5–8.1)

## 2018-03-24 LAB — BASIC METABOLIC PANEL
BUN: 117 mg/dL — ABNORMAL HIGH (ref 8–23)
CO2: 17 mmol/L — ABNORMAL LOW (ref 22–32)
CREATININE: 7.09 mg/dL — AB (ref 0.61–1.24)
Calcium: 8.2 mg/dL — ABNORMAL LOW (ref 8.9–10.3)
GFR calc Af Amer: 9 mL/min — ABNORMAL LOW (ref >60)
GFR calc non Af Amer: 7 mL/min — ABNORMAL LOW (ref >60)
Glucose, Bld: 125 mg/dL — ABNORMAL HIGH (ref 70–99)
Potassium: 4.3 mmol/L (ref 3.5–5.1)
SODIUM: 161 mmol/L — AB (ref 135–145)

## 2018-03-24 LAB — MAGNESIUM
MAGNESIUM: 2.4 mg/dL (ref 1.7–2.4)
MAGNESIUM: 2.3 mg/dL (ref 1.7–2.4)

## 2018-03-24 LAB — CBC WITH DIFFERENTIAL/PLATELET
Basophils Absolute: 0 10*3/uL (ref 0–0.1)
Basophils Relative: 0 %
EOS ABS: 0 10*3/uL (ref 0–0.7)
EOS PCT: 0 %
HCT: 48 % (ref 40.0–52.0)
HEMOGLOBIN: 15.7 g/dL (ref 13.0–18.0)
LYMPHS ABS: 0.6 10*3/uL — AB (ref 1.0–3.6)
Lymphocytes Relative: 11 %
MCH: 34.4 pg — AB (ref 26.0–34.0)
MCHC: 32.7 g/dL (ref 32.0–36.0)
MCV: 105 fL — ABNORMAL HIGH (ref 80.0–100.0)
MONOS PCT: 3 %
Monocytes Absolute: 0.2 10*3/uL (ref 0.2–1.0)
NEUTROS PCT: 86 %
Neutro Abs: 4.8 10*3/uL (ref 1.4–6.5)
Platelets: 106 10*3/uL — ABNORMAL LOW (ref 150–440)
RBC: 4.57 MIL/uL (ref 4.40–5.90)
RDW: 14.7 % — ABNORMAL HIGH (ref 11.5–14.5)
WBC: 5.6 10*3/uL (ref 3.8–10.6)

## 2018-03-24 LAB — HEPARIN LEVEL (UNFRACTIONATED): Heparin Unfractionated: 0.25 IU/mL — ABNORMAL LOW (ref 0.30–0.70)

## 2018-03-24 LAB — CBC
HCT: 45.9 % (ref 40.0–52.0)
Hemoglobin: 15.3 g/dL (ref 13.0–18.0)
MCH: 34.4 pg — AB (ref 26.0–34.0)
MCHC: 33.4 g/dL (ref 32.0–36.0)
MCV: 103.1 fL — ABNORMAL HIGH (ref 80.0–100.0)
PLATELETS: 110 10*3/uL — AB (ref 150–440)
RBC: 4.45 MIL/uL (ref 4.40–5.90)
RDW: 14 % (ref 11.5–14.5)
WBC: 13.1 10*3/uL — AB (ref 3.8–10.6)

## 2018-03-24 LAB — TROPONIN I: TROPONIN I: 0.15 ng/mL — AB (ref <0.03)

## 2018-03-24 LAB — GLUCOSE, CAPILLARY
Glucose-Capillary: 143 mg/dL — ABNORMAL HIGH (ref 70–99)
Glucose-Capillary: 96 mg/dL (ref 70–99)

## 2018-03-24 LAB — APTT: aPTT: 32 seconds (ref 24–36)

## 2018-03-24 LAB — CK: CK TOTAL: 1197 U/L — AB (ref 49–397)

## 2018-03-24 LAB — MRSA PCR SCREENING: MRSA by PCR: NEGATIVE

## 2018-03-24 LAB — PROCALCITONIN: Procalcitonin: 19.61 ng/mL

## 2018-03-24 SURGERY — EGD (ESOPHAGOGASTRODUODENOSCOPY)
Anesthesia: General

## 2018-03-24 MED ORDER — ONDANSETRON HCL 4 MG/2ML IJ SOLN
4.0000 mg | INTRAMUSCULAR | Status: DC | PRN
  Administered 2018-03-24: 4 mg via INTRAVENOUS

## 2018-03-24 MED ORDER — LACTATED RINGERS IV BOLUS
1000.0000 mL | Freq: Once | INTRAVENOUS | Status: AC
  Administered 2018-03-24: 1000 mL via INTRAVENOUS

## 2018-03-24 MED ORDER — SODIUM CHLORIDE 0.9 % IV SOLN: INTRAVENOUS | Status: DC

## 2018-03-24 MED ORDER — LABETALOL HCL 5 MG/ML IV SOLN: 10.0000 mg | INTRAVENOUS | Status: DC | PRN

## 2018-03-24 MED ORDER — DEXTROSE IN LACTATED RINGERS 5 % IV SOLN
Freq: Once | INTRAVENOUS | Status: AC
  Filled 2018-03-24: qty 1000

## 2018-03-24 MED ORDER — PANTOPRAZOLE SODIUM 40 MG PO TBEC: 40.0000 mg | DELAYED_RELEASE_TABLET | Freq: Every day | ORAL | Status: DC

## 2018-03-24 MED ORDER — MORPHINE 100MG IN NS 100ML (1MG/ML) PREMIX INFUSION
1.0000 mg/h | INTRAVENOUS | Status: DC
  Filled 2018-03-24 (×2): qty 100

## 2018-03-24 MED ORDER — METOPROLOL TARTRATE 5 MG/5ML IV SOLN: 2.0000 mg | INTRAVENOUS | Status: DC | PRN

## 2018-03-24 MED ORDER — PANTOPRAZOLE SODIUM 40 MG IV SOLR: 40.0000 mg | Freq: Two times a day (BID) | INTRAVENOUS | Status: DC

## 2018-03-24 MED ORDER — HYDRALAZINE HCL 20 MG/ML IJ SOLN: 5.0000 mg | INTRAMUSCULAR | Status: DC | PRN

## 2018-03-24 MED ORDER — ALUM & MAG HYDROXIDE-SIMETH 200-200-20 MG/5ML PO SUSP
15.0000 mL | ORAL | Status: DC | PRN
  Filled 2018-03-24: qty 30

## 2018-03-24 MED ORDER — SODIUM CHLORIDE 0.9 % IV SOLN
8.0000 mg/h | INTRAVENOUS | Status: DC
  Administered 2018-03-24 (×2): 8 mg/h via INTRAVENOUS
  Filled 2018-03-24 (×2): qty 80

## 2018-03-24 MED ORDER — SODIUM CHLORIDE 0.9 % IV SOLN
1.0000 mg/h | INTRAVENOUS | Status: DC
  Administered 2018-03-24: 3 mg/h via INTRAVENOUS
  Filled 2018-03-24: qty 100

## 2018-03-24 MED ORDER — HEPARIN (PORCINE) IN NACL 100-0.45 UNIT/ML-% IJ SOLN
600.0000 [IU]/h | INTRAMUSCULAR | Status: DC
  Administered 2018-03-24: 500 [IU]/h via INTRAVENOUS
  Filled 2018-03-24: qty 250

## 2018-03-24 MED ORDER — VITAL HIGH PROTEIN PO LIQD: 1000.0000 mL | ORAL | Status: DC

## 2018-03-24 MED ORDER — POTASSIUM CHLORIDE CRYS ER 20 MEQ PO TBCR: 20.0000 meq | EXTENDED_RELEASE_TABLET | Freq: Once | ORAL | Status: DC

## 2018-03-24 MED ORDER — BISACODYL 10 MG RE SUPP
10.0000 mg | Freq: Every day | RECTAL | Status: DC | PRN
  Administered 2018-03-24: 10 mg via RECTAL
  Filled 2018-03-24 (×2): qty 1

## 2018-03-24 MED ORDER — TWOCAL HN PO LIQD: 1000.0000 mL | ORAL | Status: DC

## 2018-03-24 MED ORDER — PHENYLEPHRINE HCL 10 MG/ML IJ SOLN
0.0000 ug/min | Freq: Once | INTRAVENOUS | Status: DC
  Filled 2018-03-24: qty 1

## 2018-03-24 MED ORDER — SODIUM CHLORIDE 0.9 % IV SOLN
80.0000 mg | Freq: Once | INTRAVENOUS | Status: AC
  Administered 2018-03-24: 80 mg via INTRAVENOUS
  Filled 2018-03-24: qty 80

## 2018-03-24 MED ORDER — GUAIFENESIN-DM 100-10 MG/5ML PO SYRP
15.0000 mL | ORAL_SOLUTION | ORAL | Status: DC | PRN
  Filled 2018-03-24: qty 15

## 2018-03-24 MED ORDER — ONDANSETRON HCL 4 MG/2ML IJ SOLN
INTRAMUSCULAR | Status: AC
  Administered 2018-03-24: 4 mg via INTRAVENOUS
  Filled 2018-03-24: qty 2

## 2018-03-24 MED ORDER — PHENOL 1.4 % MT LIQD
1.0000 | OROMUCOSAL | Status: DC | PRN
  Filled 2018-03-24: qty 177

## 2018-03-24 MED ORDER — SODIUM CHLORIDE FLUSH 0.9 % IV SOLN
INTRAVENOUS | Status: AC
  Filled 2018-03-24: qty 10

## 2018-03-24 MED ORDER — LORAZEPAM 2 MG/ML IJ SOLN
1.0000 mg | INTRAMUSCULAR | Status: DC | PRN
  Administered 2018-03-24 (×2): 1 mg via INTRAVENOUS
  Filled 2018-03-24 (×2): qty 1

## 2018-03-24 NOTE — Consult Note (Signed)
PULMONARY / CRITICAL CARE MEDICINE   Name: Willie Waller MRN: [id number] DOB: [birth year]    ADMISSION DATE:  22-Apr-2018   CONSULTATION DATE:  02/26/2018  REFERRING MD:  Dr. Evie Lacks  REASON: Acute renal failure and leg ischemia  HISTORY OF PRESENT ILLNESS: This is a 62 year old male with vascular dementia, dysphasia states she is supposed gastrostomy tube placement, and CVA with expressive aphasia who presented from [facility] with left leg ischemia.  He underwent a left iliofemoral thrombectomy this evening.  Postprocedure, patient was hypotensive requiring pressors.  He is anuric with a creatinine level of 6.67 up from his baseline of 1.3.  He is having coffee-ground emesis.  He is being admitted to the ICU for further management.  PAST MEDICAL HISTORY :  He  has a past medical history of Aphasia, Dysphagia, Hypertension, Stroke [facility]), and Vascular dementia.  PAST SURGICAL HISTORY: He  has a past surgical history that includes PEG placement.  No Known Allergies  No current facility-administered medications on file prior to encounter.    Current Outpatient Medications on File Prior to Encounter  Medication Sig  . aspirin 81 MG chewable tablet Chew by mouth daily.  . calcium carbonate (OSCAL) 1500 (600 Ca) MG TABS tablet Take by mouth 2 (two) times daily with a meal.  . escitalopram (LEXAPRO) 10 MG tablet Take 10 mg by mouth daily.  . Eyelid Cleansers (OCUSOFT EYELID CLEANSING) PADS Use 1 wipe on each eyelid daily  . ketoconazole (NIZORAL) 2 % cream 1 application. (on shower days)  . loperamide (IMODIUM) 2 MG capsule Take 2 mg by mouth as needed for diarrhea or loose stools. (max 6 doses)  . metoprolol (LOPRESSOR) 100 MG tablet Take 100 mg by mouth 2 (two) times daily.  . Nutritional Supplements (TWOCAL HN) LIQD Take 237 mLs by mouth 4 (four) times daily.    FAMILY HISTORY:  His family history includes Heart failure in his mother; Suicidality in his brother; Thyroid cancer in his  mother.  SOCIAL HISTORY: He  reports that he has quit smoking. He has never used smokeless tobacco. He reports that he drank alcohol. He reports that he has current or past drug history. Drug: IV.  REVIEW OF SYSTEMS:   Unable to obtain as patient is aphasic  SUBJECTIVE:   VITAL SIGNS: BP 116/73   Pulse (!) 115   Temp 98 F (36.7 C)   Resp (!) 32   Ht 5\' 8"  (1.727 m)   Wt 112 lb (50.8 kg)   SpO2 98%   BMI 17.03 kg/m   HEMODYNAMICS:    VENTILATOR SETTINGS:    INTAKE / OUTPUT: No intake/output data recorded.  PHYSICAL EXAMINATION: General: Appears cachectic and chronically ill looking Neuro: Awake, nonverbal, unable to follow commands HEENT: PERRLA, trachea midline, no JVD Cardiovascular: Apical pulse regular, S1-S2, no murmur regurg or gallop, no edema, nonpalpable pulses in bilateral lower extremities, +2 pulses in bilateral upper extremities Lungs: Bilateral breath sounds, diminished in the bases, no wheezes or rhonchi Abdomen: Nondistended, normal bowel sounds in all 4 quadrants Musculoskeletal: Contractures in bilateral upper and lower extremities Skin: Warm and dry, lower extremity cyanosis improved  LABS:  BMET Recent Labs  Lab 22-Apr-2018 1820 2018-04-22 2049  NA 162* 161*  K 5.1 4.2  CL 127* 128*  CO2 18* 20*  BUN 111* 112*  CREATININE 6.67* 6.21*  GLUCOSE 133* 160*    Electrolytes Recent Labs  Lab 04-22-2018 1820 Apr 22, 2018 2049  CALCIUM 10.0 9.0    CBC  Recent Labs  Lab 03/22/2018 1627 03/22/2018 2049  WBC 21.6* 17.0*  HGB 18.9* 14.9  HCT 57.1* 46.1  PLT 185 118*    Coag's Recent Labs  Lab 03/10/2018 1627 03/05/2018 2113  APTT 26  --   INR 1.31 1.83    Sepsis Markers No results for input(s): LATICACIDVEN, PROCALCITON, O2SATVEN in the last 168 hours.  ABG No results for input(s): PHART, PCO2ART, PO2ART in the last 168 hours.  Liver Enzymes Recent Labs  Lab 03/22/2018 2049  AST 65*  ALT 30  ALKPHOS 74  BILITOT 0.9  ALBUMIN 2.9*     Cardiac Enzymes Recent Labs  Lab 03/14/2018 2049  TROPONINI 0.12*    Glucose No results for input(s): GLUCAP in the last 168 hours.  Imaging Ct Angio Ao+bifem W & Or Wo Contrast  Result Date: 03/16/2018 CLINICAL DATA:  Cold left leg, no pulse EXAM: CT ANGIOGRAPHY OF ABDOMINAL AORTA WITH ILIOFEMORAL RUNOFF TECHNIQUE: Multidetector CT imaging of the abdomen, pelvis and lower extremities was performed using the standard protocol during bolus administration of intravenous contrast. Multiplanar CT image reconstructions and MIPs were obtained to evaluate the vascular anatomy. CONTRAST:  ISOVUE-370 IOPAMIDOL (ISOVUE-370) INJECTION 76% COMPARISON:  CT abdomen pelvis 03/20/2012 FINDINGS: VASCULAR Aorta: Marked atherosclerotic irregularity with extensive calcified and noncalcified plaque. Extensive irregular plaque in the distal descending thoracic aorta posteriorly. No aneurysm. Celiac: Patent SMA: Patent Renals: Occluded IMA: Heavily calcified bilaterally, patent on the left. Very small right renal artery diffusely. RIGHT Lower Extremity Inflow: Heavily calcified right common iliac artery with severe stenosis, greater than 70%. The external iliac is patent. Severely diseased internal iliac. Outflow: Moderate calcifications in the common femoral artery and throughout the superficial femoral artery. Profundus is patent with proximal moderate disease. Moderate disease in the popliteal artery. Occlusion of the distal popliteal artery. Runoff: Reconstitution of portions of the peroneal and posterior tibial arteries in the calf. Non opacification of the posterior tibial artery at the ankle likely due to proximal disease and slow flow. 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 Outflow: Minimal reconstitution of the common femoral artery likely via circumflex iliac branches. Minimal flow in the proximal superficial femoral  artery upper profunda. Complete occlusion of the superficial femoral artery below the proximal portion. Popliteal artery is completely occluded. Runoff: No flow noted within the trifurcation vessels in the left calf. Veins: Grossly unremarkable. Review of the MIP images confirms the above findings. NON-VASCULAR Lower chest: Airspace disease posteriorly in the right lower lobe could reflect atelectasis or pneumonia. Left lung base clear. No effusions. Heart is normal size. Hepatobiliary: No focal hepatic abnormality. Gallbladder unremarkable. Pancreas: No focal abnormality or ductal dilatation. Spleen: No focal abnormality.  Normal size. Adrenals/Urinary Tract: Areas of poor enhancement within the kidneys bilaterally, likely related to renovascular disease. Scarring and cortical thinning within the right kidney. No hydronephrosis. Adrenal glands and urinary bladder grossly unremarkable. Stomach/Bowel: Mild gaseous distention of the colon with scattered air-fluid levels which could reflect diarrhea. Appendix is normal. No evidence of bowel obstruction. Stomach and small bowel decompressed, unremarkable. Gastrostomy tube enters the distal stomach. The balloon is noted in the distal stomach or proximal duodenum. Lymphatic: No adenopathy. Reproductive: Prostate enlargement. Other: Hardware noted within the proximal left femur related to remote injury. Degenerative changes in the visualized thoracolumbar spine. Musculoskeletal: IMPRESSION: VASCULAR Severe atherosclerotic disease throughout the aorta. Irregular plaque posteriorly in the distal descending thoracic aorta. Heavily calcified aorta. Heavily  calcified mesenteric vessels and renal arteries bilaterally. The right renal artery is severely narrowed diffusely. 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 Minimal reconstitution in the left common femoral artery and profunda with reocclusion of the proximal  superficial femoral artery. No flow noted in the left lower extremity below the proximal SFA. Moderate to severe stenosis in the right common iliac artery, greater than 70%. Heavily calcified external iliac and common femoral and superficial femoral vessels. Complete occlusion of the distal right popliteal artery with reconstitution of portions of the peroneal and posterior tibial arteries in the right calf. NON-VASCULAR Mild gaseous distention of the colon with scattered air-fluid levels. This could be seen with diarrhea. Gastrostomy tube in place. The balloon is within the distal stomach or proximal duodenum. Atrophic right kidney. Areas of poor enhancement within the kidneys bilaterally, likely related to renovascular disease. Electronically Signed   By: Charlett NoseKevin  Dover M.D.   On: 2017/11/30 17:46   Dg Chest Port 1 View  Result Date: Jan 30, 2018 CLINICAL DATA:  Central line placement EXAM: PORTABLE CHEST 1 VIEW COMPARISON:  09/11/2016 FINDINGS: Right central line is been placed with the tip at the cavoatrial junction. Consolidation in the right lower lobe concerning for pneumonia. Left lung clear. Heart is normal size. No effusions or pneumothorax. IMPRESSION: Right central line placement with the tip at the cavoatrial junction. No pneumothorax. Continued right lower lobe opacity/consolidation. Electronically Signed   By: Charlett NoseKevin  Dover M.D.   On: 2017/11/30 20:48   SIGNIFICANT EVENTS: July 04, 2018: Admitted  LINES/TUBES: Peripheral IVs Gastrostomy tube Foley catheter Right IJ triple-lumen catheter DISCUSSION: 62 year old male presenting with acute limb ischemia now status post thrombectomy with acute renal failure.  ASSESSMENT Acute left lower extremity ischemia, status post iliofemoral thrombectomy Acute on chronic renal failure Coffee-ground emesis- rule out GI bleed Severe dysphagia status post gastrostomy tube Shock- septic versus hypovolemic Hypernatremia Leukocytosis   PLAN Discontinue  heparin per vascular surgery due to new onset coffee-ground emesis Continue to monitor lower extremities for for ischemia Gentle IV hydration with D5/LR Trend sodium level Trend procalcitonin and initiate antibiotics if necessary Nephrology consult Aspiration precautions Continue tube feedings Monitor fever curve GI consult Protonix infusion No pharmacologic DVT prophylaxis GI prophylaxis-already on Protonix infusion  FAMILY  - Updates: No family at bedside.  Will update when available  Wilbur Labuda S. [facility] ANP-BC Pulmonary and Critical Care Medicine [facility] Pager [phone] or [phone]  NB: This document was prepared using Dragon voice recognition software and may include unintentional dictation errors.    03/27/2018, 1:11 AM

## 2018-03-24 NOTE — Progress Notes (Signed)
Patient ID: Deveron Furlongrnest Mcwhirter, male   DOB: 17-Jul-1956, 62 y.o.   MRN: [id number] Pulmonary/critical care attending  Family meeting with patient's daughter. Dr. Evie LacksEsco is present along with nurse Related multiple medical issues to include renal failure, status postlimb ischemia, upper gastrointestinal bleeding, prior history of stroke, respiratory distress, probable aspiration pneumonia. Patient is a DO NOT RESUSCITATE with prior history of CVA. Daughter states that he would not want aggressive care. We discussed comfort care measures and she is agreeable. We will place on a morphine infusion, as needed benzodiazepine and keep patient comfortable. Chaplain has been notified  Tora KindredJohn Jeweline Reif, D.O.

## 2018-03-24 NOTE — Progress Notes (Signed)
   03/16/2018 1755  Clinical Encounter Type  Visited With Patient and family together  Visit Type Initial;Spiritual support;Patient actively dying  Referral From Nurse  Consult/Referral To Chaplain  Spiritual Encounters  Spiritual Needs Prayer;Emotional;Grief support   [facility] received a page that patient had been moved to comfort care. I met with the family and talked through the comfort care process. The doctor had already talked with the family but they needed reassuring. Patient's daughter and ex-wife were present and shared that they were the patient's only family. I will follow up as needed.

## 2018-03-24 NOTE — Progress Notes (Signed)
Pt from PACU this am with no pedal pulses, generalized mottling, tachypnea, with labile oxygen saturation, DNR. Overall condition/appearance declined throughout shift.  MD x 2 discussed with daughter/famly and placed patient on comfort measures. Morphine drip titrated to pt comfort, family at bedside, chaplin consulted, bereavement cart at bedside. Pt resting quietly at this time.

## 2018-03-24 NOTE — Progress Notes (Signed)
1 Day Post-Op   Subjective/Chief Complaint: Some hypotension Coffee ground emesis and per GTube per report last night Worsening renal function- Cr-7, minimal UO Tachypnea   Objective: Vital signs in last 24 hours: Temp:  [96.4 F (35.8 C)-99.4 F (37.4 C)] 99.4 F (37.4 C) (07/28 0800) Pulse Rate:  [94-129] 123 (07/28 0900) Resp:  [16-54] 30 (07/28 0900) BP: (84-124)/(45-96) 108/78 (07/28 0900) SpO2:  [84 %-100 %] 92 % (07/28 0900) Weight:  [50.8 kg (112 lb)] 50.8 kg (112 lb) (07/27 1645)    Intake/Output from previous day: 07/27 0701 - 07/28 0700 In: 2600 [I.V.:2500; IV Piggyback:100] Out: 302 [Urine:107; Emesis/NG output:185; Blood:10] Intake/Output this shift: No intake/output data recorded.  General appearance: Baseline- aphasic, minimal purposeful interaction Cardio: tachycardia, regular GI: soft, non-tender; bowel sounds normal; no masses,  no organomegaly Extremities: RIGHT: warm +DP/PT; LEFT: warm to ankle, foot cool with some mottled, no DP/PT Pulses:   Lab Results:  Recent Labs    2018-04-16 2049 03/27/2018 0554  WBC 17.0* 13.1*  HGB 14.9 15.3  HCT 46.1 45.9  PLT 118* 110*   BMET Recent Labs    04/16/2018 2049 03/19/2018 0554  NA 161* 161*  K 4.2 4.6  CL 128* >130*  CO2 20* 20*  GLUCOSE 160* 144*  BUN 112* 112*  CREATININE 6.21* 7.16*  CALCIUM 9.0 8.5*   PT/INR Recent Labs    04/16/2018 2113 03/23/2018 0554  LABPROT 21.0* 18.2*  INR 1.83 1.52   ABG No results for input(s): PHART, HCO3 in the last 72 hours.  Invalid input(s): PCO2, PO2  Studies/Results: Dg Abd 1 View  Result Date: 03/06/2018 CLINICAL DATA:  Nausea and vomiting. EXAM: ABDOMEN - 1 VIEW COMPARISON:  CTA of the abdomen and pelvis 7 hours prior. FINDINGS: Increased colonic air is seen on prior CT. No small bowel dilatation. Gastrostomy tube in the upper abdomen. No evidence of free air. Atherosclerotic calcifications. Catheter overlies the pelvis decompressing the urinary bladder.  The bones appear under mineralized. IMPRESSION: Air-filled colon as seen on recent prior CT without evidence of obstruction. No small bowel dilatation. Electronically Signed   By: Rubye Oaks M.D.   On: 03/23/2018 01:45   Ct Angio Ao+bifem W & Or Wo Contrast  Result Date: 04/16/2018 CLINICAL DATA:  Cold left leg, no pulse EXAM: CT ANGIOGRAPHY OF ABDOMINAL AORTA WITH ILIOFEMORAL RUNOFF TECHNIQUE: Multidetector CT imaging of the abdomen, pelvis and lower extremities was performed using the standard protocol during bolus administration of intravenous contrast. Multiplanar CT image reconstructions and MIPs were obtained to evaluate the vascular anatomy. CONTRAST:  ISOVUE-370 IOPAMIDOL (ISOVUE-370) INJECTION 76% COMPARISON:  CT abdomen pelvis 03/20/2012 FINDINGS: VASCULAR Aorta: Marked atherosclerotic irregularity with extensive calcified and noncalcified plaque. Extensive irregular plaque in the distal descending thoracic aorta posteriorly. No aneurysm. Celiac: Patent SMA: Patent Renals: Occluded IMA: Heavily calcified bilaterally, patent on the left. Very small right renal artery diffusely. RIGHT Lower Extremity Inflow: Heavily calcified right common iliac artery with severe stenosis, greater than 70%. The external iliac is patent. Severely diseased internal iliac. Outflow: Moderate calcifications in the common femoral artery and throughout the superficial femoral artery. Profundus is patent with proximal moderate disease. Moderate disease in the popliteal artery. Occlusion of the distal popliteal artery. Runoff: Reconstitution of portions of the peroneal and posterior tibial arteries in the calf. Non opacification of the posterior tibial artery at the ankle likely due to proximal disease and slow flow. 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 Outflow: Minimal reconstitution of the common femoral artery likely via  circumflex iliac branches. Minimal flow in the proximal superficial femoral artery upper profunda. Complete occlusion of the superficial femoral artery below the proximal portion. Popliteal artery is completely occluded. Runoff: No flow noted within the trifurcation vessels in the left calf. Veins: Grossly unremarkable. Review of the MIP images confirms the above findings. NON-VASCULAR Lower chest: Airspace disease posteriorly in the right lower lobe could reflect atelectasis or pneumonia. Left lung base clear. No effusions. Heart is normal size. Hepatobiliary: No focal hepatic abnormality. Gallbladder unremarkable. Pancreas: No focal abnormality or ductal dilatation. Spleen: No focal abnormality.  Normal size. Adrenals/Urinary Tract: Areas of poor enhancement within the kidneys bilaterally, likely related to renovascular disease. Scarring and cortical thinning within the right kidney. No hydronephrosis. Adrenal glands and urinary bladder grossly unremarkable. Stomach/Bowel: Mild gaseous distention of the colon with scattered air-fluid levels which could reflect diarrhea. Appendix is normal. No evidence of bowel obstruction. Stomach and small bowel decompressed, unremarkable. Gastrostomy tube enters the distal stomach. The balloon is noted in the distal stomach or proximal duodenum. Lymphatic: No adenopathy. Reproductive: Prostate enlargement. Other: Hardware noted within the proximal left femur related to remote injury. Degenerative changes in the visualized thoracolumbar spine. Musculoskeletal: IMPRESSION: VASCULAR Severe atherosclerotic disease throughout the aorta. Irregular plaque posteriorly in the distal descending thoracic aorta. Heavily calcified aorta. Heavily calcified mesenteric vessels and renal arteries bilaterally. The right renal artery is severely narrowed diffusely. 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 Minimal reconstitution in  the left common femoral artery and profunda with reocclusion of the proximal superficial femoral artery. No flow noted in the left lower extremity below the proximal SFA. Moderate to severe stenosis in the right common iliac artery, greater than 70%. Heavily calcified external iliac and common femoral and superficial femoral vessels. Complete occlusion of the distal right popliteal artery with reconstitution of portions of the peroneal and posterior tibial arteries in the right calf. NON-VASCULAR Mild gaseous distention of the colon with scattered air-fluid levels. This could be seen with diarrhea. Gastrostomy tube in place. The balloon is within the distal stomach or proximal duodenum. Atrophic right kidney. Areas of poor enhancement within the kidneys bilaterally, likely related to renovascular disease. Electronically Signed   By: Charlett Nose M.D.   On: 04/05/2018 17:46   Dg Chest Port 1 View  Result Date: 04-05-18 CLINICAL DATA:  Central line placement EXAM: PORTABLE CHEST 1 VIEW COMPARISON:  09/11/2016 FINDINGS: Right central line is been placed with the tip at the cavoatrial junction. Consolidation in the right lower lobe concerning for pneumonia. Left lung clear. Heart is normal size. No effusions or pneumothorax. IMPRESSION: Right central line placement with the tip at the cavoatrial junction. No pneumothorax. Continued right lower lobe opacity/consolidation. Electronically Signed   By: Charlett Nose M.D.   On: 2018-04-05 20:48    Anti-infectives: Anti-infectives (From admission, onward)   None      Assessment/Plan: s/p Procedure(s): thromboembolectomy (Left) Appreciate Consultant input and care.  Remains guarded Left leg with significant improvement however- foot remains threatened- will attempt to restart Heparin gtt at 500U/hr, if no further coffee ground will increase per protocol. Monitor closely. Would not recommend further Vascular intervention at this juncture. Unclear how long  patient was ischemic- likely additional showering to foot/toes. Will have GI to evaluate to ensure no other concerning pathology with coffee ground/Gtube Renal Function: Continue fluids  and recs per Nephrology: CTA shows renovascular disease at baseline-atrophy. Continue supportive care Trickle Gtube feeds later. Discuss with family regarding further wishes.  LOS: 1 day    Eli Hosesco, Dakiya Puopolo A 02/25/2018

## 2018-03-24 NOTE — Progress Notes (Signed)
Patient with progressive deterioration over the last few hours. Generalized mottling, tachypnea, increased O2 demand- on 100% NRB. Tachycardia. Renal function- not improving despite aggressive fluid resuscitation, increased Troponin. In addition increased coffee ground output from Gtube.  Discussed with Dr. Lonn Georgiaonforti- agree that comfort care is best as there are limited medical options at this juncture and intervention would unlikely improve his current status.  Discussed at length with Daughter. All questions answered, understanding expressed. Wishes to proceed with comfort care.

## 2018-03-24 NOTE — Progress Notes (Signed)
Initial Nutrition Assessment  DOCUMENTATION CODES:   Severe malnutrition in context of chronic illness, Underweight  INTERVENTION:  Patient had coffee-ground emesis last night. Plan is to re-assess in PM for possible initiation of trickle tube feeds.  If okay for initiation of tube feeds this evening from GI standpoint, recommend initiating TwoCal HN at 20 mL/hr via G-tube.  In AM if patient is tolerating trickle rate, recommend advancing by 15 mL/hr every 8 hours to goal regimen of TwoCal HN at 50 mL/hr (1200 mL goal daily volume) via G-tube. Provides 2400 kcal, 100 grams of protein, 840 mL H2O.  Provide free water flush of 230 mL Q3hrs once advancing to goal TF rate. Provides a total of 2680 mL including water in tube feeding (similar to total H2O provided from usual TF regimen).  Once patient is stable enough, resume bolus regimen of TwoCal HN 237 mL 5 times daily via G-tube with free water flush of 30 mL before and after each bolus tube feeding and an additional 260 mL free water flush Q4hrs.  NUTRITION DIAGNOSIS:   Severe Malnutrition related to chronic illness(vascular dementia, hx CVA, dysphagia s/p G-tube placement) as evidenced by severe fat depletion, severe muscle depletion.  GOAL:   Patient will meet greater than or equal to 90% of their needs  MONITOR:   Diet advancement, Labs, Weight trends, TF tolerance, Skin, I & O's  REASON FOR ASSESSMENT:   Consult Enteral/tube feeding initiation and management  ASSESSMENT:   62 year old male with PMHx of vascular dementia, remote EtOH abuse and IV drug abuse, hx CVA, expressive aphasia, dysphagia s/p G-tube placement, HTN who was admitted from [facility] with ischemic left lower extremity s/p left iliofemoral thromboembolectomy on 7/27, also with coffee-ground emesis and acute kidney injury.   -Pending GI consult to rule out GI bleed.  Patient alert in room but unable to provide any history. No family members present.  Overnight patient was hypotensive requiring pressors but now is off pressors. Also had some coffee-ground emesis overnight so pending GI consult today. G-tube currently hooked up to LIS. Very limited history available in chart on patient. There are notes dating back to 10/20/2011 where G-tube was already in place but cannot find exact date of placement. Will discuss patient with RD from [facility] once she is back in the office after the weekend.  Per chart from [facility] patient is on a pureed diet with nectar-thick liquids for pleasure only. His tube feed regimen is TwoCal HN 237 mL bolus 5 times daily (2375 kcal, 99.5 grams of protein, 830 mL H2O daily). He receives free water flush of 30 mL before and after each bolus tube feeding plus an additional 260 mL free water Q4hrs (total of 2690 mL H2O daily including water in tube feeding).  Per chart patient was 113.8 lbs on 11/15/2011 and 112.1 lbs on 09/11/2016. Weight on admission was 53.9 kg (118.8 lbs). No weights available from prior to CVA so unable to trend when patient lost his weight initially.  Enteral Access: Kangaroo G-tube present on admission; [place] size not visible on tube at time of RD assessment; per paper chart from [facility] it is an 18 Fr. G-tube with 10 cc balloon  Medications reviewed and include: pantoprazole, potassium chloride 20-40 mEq scale PO/IV once today, D5 in LR at 75 mL/hr (90 grams dextrose, 306 kcal daily), heparin gtt, pantoprazole gtt, phenylephrine gtt now off.  Labs reviewed: CBG 143, Sodium 161, Chloride >130, CO2  20, BUN 112, Creatinine 7.16, Phosphorus 4.7.  I/O: only 107 mL UOP overnight and 100 mL so far this shift; 185 mL emesis overnight  Discussed with RN, Dr. Evie LacksEsco, and Dr. Lonn Georgiaonforti. Plan is to re-assess for possible initiation of tube feeds this evening. Patient is typically on a bolus regimen, but plan is to resume tube feeds with a continuous trickle rate when appropriate. Can then transition to  bolus regimen when stable enough.  NUTRITION - FOCUSED PHYSICAL EXAM:    Most Recent Value  Orbital Region  Severe depletion  Upper Arm Region  Severe depletion  Thoracic and Lumbar Region  Moderate depletion  Buccal Region  Severe depletion  Temple Region  Severe depletion  Clavicle Bone Region  Moderate depletion  Clavicle and Acromion Bone Region  Severe depletion  Scapular Bone Region  Unable to assess  Dorsal Hand  Moderate depletion  Patellar Region  Severe depletion  Anterior Thigh Region  Severe depletion  Posterior Calf Region  Severe depletion  Edema (RD Assessment)  None  Hair  Reviewed  Eyes  Unable to assess  Mouth  Unable to assess  Skin  Reviewed  Nails  Reviewed     Diet Order:   Diet Order           Diet NPO time specified  Diet effective now          EDUCATION NEEDS:   Not appropriate for education at this time  Skin:  Skin Assessment: Skin Integrity Issues: Skin Integrity Issues:: Incisions Incisions: closed incision to left leg  Last BM:  Unknown/PTA  Height:   Ht Readings from Last 1 Encounters:  03/05/2018 5\' 8"  (1.727 m)    Weight:   Wt Readings from Last 1 Encounters:  03/09/2018 118 lb 13.3 oz (53.9 kg)    Ideal Body Weight:  70 kg  BMI:  Body mass index is 18.07 kg/m.  Estimated Nutritional Needs:   Kcal:  [id number] (40-45 kcal/kg for weight gain)  Protein:  97-108 grams (1.8-2 grams/kg)  Fluid:  2.4-2.7 L/day  Helane RimaLeanne Kordell Jafri, MS, RD, LDN Office: [phone] Pager: [phone] After Hours/Weekend Pager: [phone]

## 2018-03-24 NOTE — Consult Note (Signed)
Willie Darby, MD [address]  [place]  [place], [place] [address]  Main: [phone]  Fax: [phone] Pager: [phone]   Consultation  Referring Provider:     No ref. provider found Primary Care Physician:  System, Pcp Not In Primary Gastroenterologist: None         Reason for Consultation:     Coffee-ground emesis  Date of Admission:  03/03/2018 Date of Consultation:  03/14/2018         HPI:   Willie Waller is a 62 y.o. 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 He was taken emergently OR for thromboembolectomy. Patient was noted to have scant amounts of coffee-ground material via the PEG tube that was connected to suction. He was started on low-dose heparin and pantoprazole drip postprocedure. GI is consulted for further evaluation. Patient is nonverbal, unable to provide any history. History is obtained from his nurse and chart review. He is in acute renal failure secondary to severe dehydration. Did not require any pressors yet.   His hemoglobin on admission was 18.9 which is in the setting of severe dehydration. His hemoglobin at baseline is around 14.  He has mild thrombocytopenia. He had elevated leukocytosis 21.6 on admission, down to 13 today.  CT angiogram also revealed PEG tube in distal stomach and proximal duodenum Mildly distended colon with scattered air-fluid levels  NSAIDs: none  Antiplts/Anticoagulants/Anti thrombotics: aspirin 81  GI Procedures: colonoscopy in 2011 at [facility], report not available  Past Medical History:  Diagnosis Date  . Aphasia   . Dysphagia   . Hypertension   .  Stroke ([place])   . Vascular dementia     Past Surgical History:  Procedure Laterality Date  . PEG PLACEMENT      Prior to Admission medications   Medication Sig Start Date End Date Taking? Authorizing Provider  aspirin 81 MG chewable tablet Chew by mouth daily.   Yes [provider]  calcium carbonate (OSCAL) 1500 (600 Ca) MG TABS tablet Take by mouth 2 (two) times daily with a meal.   Yes [provider]  escitalopram (LEXAPRO) 10 MG tablet Take 10 mg by mouth daily.   Yes [provider]  Eyelid Cleansers (OCUSOFT EYELID CLEANSING) PADS Use 1 wipe on each eyelid daily   Yes [provider]  ketoconazole (NIZORAL) 2 % cream 1 application. (on shower days)   Yes [provider]  loperamide (IMODIUM) 2 MG capsule Take 2 mg by mouth as needed for diarrhea or loose stools. (max 6 doses)   Yes [provider]  metoprolol (LOPRESSOR) 100 MG tablet Take 100 mg by mouth 2 (two) times daily.   Yes [provider]  Nutritional Supplements (TWOCAL HN) LIQD Take 237 mLs by mouth 4 (four) times daily.   Yes [provider]    Current Facility-Administered Medications:  .  alum & mag hydroxide-simeth (MAALOX/MYLANTA) 200-200-20 MG/5ML suspension 15-30 mL, 15-30 mL, Oral, Q2H PRN, Esco, Miechia A, MD .  bisacodyl (DULCOLAX) suppository 10 mg, 10 mg, Rectal, Daily PRN, Tukov-Yual, Magdalene S, NP, 10 mg at 03/11/2018 0335 .  dextrose 5 % in lactated ringers infusion, ,  Intravenous, Continuous, Vanga, Tally Due, MD, Last Rate: 75 mL/hr at 03/20/2018 1055 .  dextrose 5 % in lactated ringers infusion, , Intravenous, Once, Esco, Miechia A, MD, Last Rate: 500 mL/hr at 03/22/2018 1016 .  guaiFENesin-dextromethorphan (ROBITUSSIN DM) 100-10 MG/5ML syrup 15 mL, 15 mL, Oral, Q4H PRN, Esco, Miechia A, MD .  heparin ADULT infusion 100 units/mL (25000 units/248m sodium chloride 0.45%), 500 Units/hr, Intravenous, Continuous, Esco, Miechia A, MD, Last  Rate: 5 mL/hr at 03/08/2018 1045, 500 Units/hr at 03/05/2018 1045 .  hydrALAZINE (APRESOLINE) injection 5 mg, 5 mg, Intravenous, Q20 Min PRN, Esco, Miechia A, MD .  labetalol (NORMODYNE,TRANDATE) injection 10 mg, 10 mg, Intravenous, Q10 min PRN, Esco, Miechia A, MD .  lactated ringers bolus 1,000 mL, 1,000 mL, Intravenous, Once, Vanga, RTally Due MD .  metoprolol tartrate (LOPRESSOR) injection 2-5 mg, 2-5 mg, Intravenous, Q2H PRN, Esco, Miechia A, MD .  ondansetron (ZOFRAN) injection 4 mg, 4 mg, Intravenous, Q4H PRN, Tukov-Yual, Magdalene S, NP, 4 mg at 03/22/2018 0115 .  pantoprazole (PROTONIX) 80 mg in sodium chloride 0.9 % 250 mL (0.32 mg/mL) infusion, 8 mg/hr, Intravenous, Continuous, Tukov-Yual, Magdalene S, NP, Last Rate: 25 mL/hr at 03/17/2018 1103, 8 mg/hr at 03/15/2018 1103 .  [START ON 03/27/2018] pantoprazole (PROTONIX) injection 40 mg, 40 mg, Intravenous, Q12H, Tukov-Yual, Magdalene S, NP .  phenol (CHLORASEPTIC) mouth spray 1 spray, 1 spray, Mouth/Throat, PRN, Esco, Miechia A, MD .  phenylephrine (NEO-SYNEPHRINE) 10 mg in dextrose 5 % 250 mL (0.04 mg/mL) infusion, 0-400 mcg/min, Intravenous, Once, Tukov-Yual, Magdalene S, NP .  potassium chloride SA (K-DUR,KLOR-CON) CR tablet 20-40 mEq, 20-40 mEq, Oral, Once, Esco, Miechia A, MD .  sodium chloride flush 0.9 % injection, , , ,  .  TWOCAL HN liquid 1,000 mL, 1,000 mL, Per Tube, Q24H, Esco, Miechia A, MD  Family History  Problem Relation Age of Onset  . Heart failure Mother   . Thyroid cancer Mother   . Suicidality Brother      Social History   Tobacco Use  . Smoking status: Former SResearch scientist (life sciences) . Smokeless tobacco: Never Used  Substance Use Topics  . 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 (No Known Allergies)    Review of Systems:    All systems reviewed and negative except where noted in HPI.    Physical Exam:  Vital signs in last 24 hours: Temp:  [96.4 F (35.8 C)-99.4 F (37.4 C)] 99.4 F (37.4 C) (07/28 0800) Pulse Rate:  [94-129] 111 (07/28 1100) Resp:  [16-54] 40 (07/28 1300) BP: (84-127)/(45-96) 117/57 (07/28 1300) SpO2:  [84 %-100 %] 97 % (07/28 1100) Weight:  [112 lb (50.8 kg)-118 lb 13.3 oz (53.9 kg)] 118 lb 13.3 oz (53.9 kg) (07/28 1135)   General:  Cachectic, not in distress Head: bitemporal wasting Eyes:   No icterus.   Conjunctiva pink. PERRLA. Ears:  Normal auditory acuity. Neck:  Supple; no masses or thyroidomegaly Lungs: Respirations even and unlabored. Lungs clear to auscultation bilaterally.   No wheezes, crackles, or rhonchi.  Heart:  Tachycardia,  Without murmur, clicks, rubs or gallops Abdomen:  Soft, nondistended, nontender. Normal bowel sounds. No appreciable masses or hepatomegaly.  No rebound or guarding. PEG tube in place, the external bumper was at 6 cm. I readjusted the PEG,  pulled it out 3 cm and placed the bumper at 3 cm. The PEG site appeared healthy, without any purulent discharge or blood, has circumferential motion. I was able to flush the PEG with 50 mL water without any resistance. Scant amounts of dark greenish material was coming from the PEG tube. Rectal: blood mixed with loose stool and mucus on rectal exam Msk: muscle wasting, cold left foot, mottled  Extremities:  Without edema,cyanotic left foot Neurologic: nonverbal, noncoherent does not follow commands Skin: dry skin, Intact without significant lesions or rashes.   LAB RESULTS: CBC Latest Ref Rng & Units 03/10/2018 02/27/2018 03/22/2018  WBC 3.8 - 10.6 K/uL 13.1(H) 17.0(H) 21.6(H)  Hemoglobin 13.0 - 18.0 g/dL 15.3 14.9 18.9(H)  Hematocrit 40.0 - 52.0 % 45.9 46.1 57.1(H)  Platelets 150 - 440 K/uL 110(L) 118(L) 185    BMET BMP Latest Ref Rng & Units 03/07/2018 03/27/2018 03/01/2018  Glucose 70 - 99 mg/dL 144(H) 160(H) 133(H)  BUN 8 - 23 mg/dL 112(H) 112(H) 111(H)  Creatinine 0.61 -  1.24 mg/dL 7.16(H) 6.21(H) 6.67(H)  Sodium 135 - 145 mmol/L 161(HH) 161(HH) 162(HH)  Potassium 3.5 - 5.1 mmol/L 4.6 4.2 5.1  Chloride 98 - 111 mmol/L >130(HH) 128(H) 127(H)  CO2 22 - 32 mmol/L 20(L) 20(L) 18(L)  Calcium 8.9 - 10.3 mg/dL 8.5(L) 9.0 10.0    LFT Hepatic Function Latest Ref Rng & Units 03/01/2018 03/15/2018 09/12/2016  Total Protein 6.5 - 8.1 g/dL 7.0 7.2 -  Albumin 3.5 - 5.0 g/dL 2.8(L) 2.9(L) -  AST 15 - 41 U/L 82(H) 65(H) -  ALT 0 - 44 U/L 31 30 -  Alk Phosphatase 38 - 126 U/L 65 74 -  Total Bilirubin 0.3 - 1.2 mg/dL 0.7 0.9 1.7(H)     STUDIES: Dg Abd 1 View  Result Date: 03/10/2018 CLINICAL DATA:  Nausea and vomiting. EXAM: ABDOMEN - 1 VIEW COMPARISON:  CTA of the abdomen and pelvis 7 hours prior. FINDINGS: Increased colonic air is seen on prior CT. No small bowel dilatation. Gastrostomy tube in the upper abdomen. No evidence of free air. Atherosclerotic calcifications. Catheter overlies the pelvis decompressing the urinary bladder. The bones appear under mineralized. IMPRESSION: Air-filled colon as seen on recent prior CT without evidence of obstruction. No small bowel dilatation. Electronically Signed   By: Jeb Levering M.D.   On: 03/09/2018 01:45   Ct Angio Ao+bifem W & Or Wo Contrast  Result Date: 03/07/2018 CLINICAL DATA:  Cold left leg, no pulse EXAM: CT ANGIOGRAPHY OF ABDOMINAL AORTA WITH ILIOFEMORAL RUNOFF TECHNIQUE: Multidetector CT imaging of the abdomen, pelvis and lower extremities was performed using the standard protocol during bolus administration of intravenous contrast. Multiplanar CT image reconstructions and MIPs were obtained to evaluate the vascular anatomy. CONTRAST:  19m ISOVUE-370 IOPAMIDOL (ISOVUE-370) INJECTION 76% COMPARISON:  CT abdomen pelvis 03/20/2012 FINDINGS: VASCULAR Aorta: Marked atherosclerotic irregularity with extensive calcified and noncalcified plaque. Extensive irregular plaque in the distal descending thoracic aorta posteriorly.  No aneurysm. Celiac: Patent SMA: Patent Renals: Occluded IMA: Heavily calcified bilaterally, patent on the left. Very small right renal artery diffusely. RIGHT Lower Extremity Inflow: Heavily calcified right common iliac artery with severe stenosis, greater than 70%. The external iliac is patent. Severely diseased internal iliac. Outflow: Moderate calcifications in the common femoral artery and throughout the superficial femoral artery. Profundus is patent with proximal moderate disease. Moderate disease in the popliteal artery. Occlusion of the distal popliteal artery. Runoff: Reconstitution of portions of the peroneal and posterior tibial arteries in  the calf. Non opacification of the posterior tibial artery at the ankle likely due to proximal disease and slow flow. 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 Outflow: Minimal reconstitution of the common femoral artery likely via circumflex iliac branches. Minimal flow in the proximal superficial femoral artery upper profunda. Complete occlusion of the superficial femoral artery below the proximal portion. Popliteal artery is completely occluded. Runoff: No flow noted within the trifurcation vessels in the left calf. Veins: Grossly unremarkable. Review of the MIP images confirms the above findings. NON-VASCULAR Lower chest: Airspace disease posteriorly in the right lower lobe could reflect atelectasis or pneumonia. Left lung base clear. No effusions. Heart is normal size. Hepatobiliary: No focal hepatic abnormality. Gallbladder unremarkable. Pancreas: No focal abnormality or ductal dilatation. Spleen: No focal abnormality.  Normal size. Adrenals/Urinary Tract: Areas of poor enhancement within the kidneys bilaterally, likely related to renovascular disease. Scarring and cortical thinning within the right kidney. No hydronephrosis. Adrenal glands and urinary bladder grossly  unremarkable. Stomach/Bowel: Mild gaseous distention of the colon with scattered air-fluid levels which could reflect diarrhea. Appendix is normal. No evidence of bowel obstruction. Stomach and small bowel decompressed, unremarkable. Gastrostomy tube enters the distal stomach. The balloon is noted in the distal stomach or proximal duodenum. Lymphatic: No adenopathy. Reproductive: Prostate enlargement. Other: Hardware noted within the proximal left femur related to remote injury. Degenerative changes in the visualized thoracolumbar spine. Musculoskeletal: IMPRESSION: VASCULAR Severe atherosclerotic disease throughout the aorta. Irregular plaque posteriorly in the distal descending thoracic aorta. Heavily calcified aorta. Heavily calcified mesenteric vessels and renal arteries bilaterally. The right renal artery is severely narrowed diffusely. 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 Minimal reconstitution in the left common femoral artery and profunda with reocclusion of the proximal superficial femoral artery. No flow noted in the left lower extremity below the proximal SFA. Moderate to severe stenosis in the right common iliac artery, greater than 70%. Heavily calcified external iliac and common femoral and superficial femoral vessels. Complete occlusion of the distal right popliteal artery with reconstitution of portions of the peroneal and posterior tibial arteries in the right calf. NON-VASCULAR Mild gaseous distention of the colon with scattered air-fluid levels. This could be seen with diarrhea. Gastrostomy tube in place. The balloon is within the distal stomach or proximal duodenum. Atrophic right kidney. Areas of poor enhancement within the kidneys bilaterally, likely related to renovascular disease. Electronically Signed   By: Rolm Baptise M.D.   On: 03/06/2018 17:46   Dg Chest Port 1 View  Result Date: 03/10/2018 CLINICAL DATA:  Shortness  of breath. EXAM: PORTABLE CHEST 1 VIEW COMPARISON:  03/04/2018 and prior exams FINDINGS: The cardiomediastinal silhouette is unremarkable. A RIGHT IJ central venous catheter with tip overlying the SUPERIOR cavoatrial junction again noted. RIGHT LOWER lung opacity has slightly improved. No pneumothorax, pleural effusion or new pulmonary opacity. No acute bony abnormalities identified. IMPRESSION: Improved RIGHT LOWER lung opacity/airspace disease. No other significant change. Electronically Signed   By: Margarette Canada M.D.   On: 02/26/2018 13:31   Dg Chest Port 1 View  Result Date: 03/12/2018 CLINICAL DATA:  Central line placement EXAM: PORTABLE CHEST 1 VIEW COMPARISON:  09/11/2016 FINDINGS: Right central line is been placed with the tip at the cavoatrial junction. Consolidation in the right lower lobe concerning for pneumonia. Left lung clear. Heart is normal size. No effusions or pneumothorax. IMPRESSION: Right central  line placement with the tip at the cavoatrial junction. No pneumothorax. Continued right lower lobe opacity/consolidation. Electronically Signed   By: Rolm Baptise M.D.   On: 03/17/2018 20:48      Impression / Plan:   Willie Waller is a 62 y.o. Caucasian male with history of CVA, status post PEG in 2011 8 [facility], vascular dementia admitted from nursing home with acute ischemia of the left lower extremity status post left iliofemoral thromboembolectomy on 03/15/2018, acute renal failure, GI is consulted for evaluation of coffee-ground emesis. Rectal exam also revealed loose stool mixed with blood and mucus. With regards to the concern about GI bleeding, patient has hemodynamically insignificant GI bleed.  Coffee-ground emesis: Differentials include erosive esophagitis or gastritis or peptic ulcer disease or mucosal irritation from the internal bumper of the gastrostomy tube entering into the distal stomach or proximal duodenum. The PEG tube is pulled back and readjusted. - Continue pantoprazole  drip - Okay with trickle feeds via PEG tube - nothing by mouth - Agree to continue therapeutic anticoagulation to salvage his left lower extremity as coffee-ground emesis is not hemodynamically significant at this time.  - Monitor CBC every 8 hours and closely watch for further episodes of hematemesis or melena or hematochezia - unable to perform EGD today as patient is deemed not safe for anesthesia. Anesthesiologist recommend cardiology clearance due to elevated troponins  Rectal bleeding: differentials include ischemic colitis or C. Difficile colitis or stercoral ulcer CT angio reveals complete occlusion of inferior mesenteric artery, however his celiac and SMA are patent. He does have significant atherosclerotic disease. His abdominal exam was benign. I have personally discussed the CT findings with the vascular surgeon, Dr. Lorenso Courier concerning for any underlying mesenteric ischemia. She does not recommend further vascular intervention due to patient's critical illness Continue therapeutic anticoagulation Aggressive hydration Rule out C. Difficile  Discussed my recommendations with Dr Lorenso Courier and Dr Jefferson Fuel I personally discussed my recommendations with patient's daughter over phone and limitations in not pursuing endoscopy intervention at this time  Thank you for involving me in the care of this patient. Dr. Vicente Males to cover from tomorrow    LOS: 1 day   Sherri Sear, MD  03/01/2018, 1:43 PM   Note: This dictation was prepared with Dragon dictation along with smaller phrase technology. Any transcriptional errors that result from this process are unintentional.

## 2018-03-24 NOTE — Progress Notes (Signed)
Date: 03/17/2018                  Patient Name:  Willie Waller  MRN: [id number]  DOB: 1955-10-12  Age / Sex: 62 y.o., male         PCP: System, Pcp Not In                 Service Requesting Consult: IM/ Bertram Denver, MD                 Reason for Consult: ARF            History of Present Illness: Patient is a 62 y.o. male with medical problems of stroke, AVM, dysphagia requiring PEG tube, hypertension, history of hospitalization for sepsis/pneumonia in January 2019, vascular dementia, remote history of IV drug use, who was admitted to [facility] on 2018-04-10 from [facility] (resident for more than 8 to 9 years) secondary to left leg acute ischemia.  Patient underwent emergent left lower extremity revascularization He underwent CT angiogram which showed severe vascular disease and atrophic right kidney.  Results are summarized below.  Upon admission, patient was noted to have elevated creatinine of 6.21, BUN of 112 nephrology consult was requested for further evaluation Please note all information is obtained from the chart as patient is nonverbal.  He is only able to nod yes/no to few questions and follow simple basic commands  Medications: Outpatient medications: Medications Prior to Admission  Medication Sig Dispense Refill Last Dose  . aspirin 81 MG chewable tablet Chew by mouth daily.   Unknown at Unknown  . calcium carbonate (OSCAL) 1500 (600 Ca) MG TABS tablet Take by mouth 2 (two) times daily with a meal.   Unknown at Unknown  . escitalopram (LEXAPRO) 10 MG tablet Take 10 mg by mouth daily.   Unknown at Unknown  . Eyelid Cleansers (OCUSOFT EYELID CLEANSING) PADS Use 1 wipe on each eyelid daily   Unknown at Unknown  . ketoconazole (NIZORAL) 2 % cream 1 application. (on shower days)   Unknown at Unknown  . loperamide (IMODIUM) 2 MG capsule Take 2 mg by mouth as needed for diarrhea or loose stools. (max 6 doses)   PRN at PRN  . metoprolol (LOPRESSOR) 100 MG tablet Take 100 mg  by mouth 2 (two) times daily.   Unknown at Unknown  . Nutritional Supplements (TWOCAL HN) LIQD Take 237 mLs by mouth 4 (four) times daily.   As directed at As directed    Current medications: Current Facility-Administered Medications  Medication Dose Route Frequency Provider Last Rate Last Dose  . alum & mag hydroxide-simeth (MAALOX/MYLANTA) 200-200-20 MG/5ML suspension 15-30 mL  15-30 mL Oral Q2H PRN Esco, Miechia A, MD      . bisacodyl (DULCOLAX) suppository 10 mg  10 mg Rectal Daily PRN Tukov-Yual, Magdalene S, NP   10 mg at 03/02/2018 0335  . dextrose 5 % in lactated ringers infusion   Intravenous Continuous Conforti, John, DO 50 mL/hr at 03/13/2018 2130    . dextrose 5 % in lactated ringers infusion   Intravenous Once Esco, Miechia A, MD      . guaiFENesin-dextromethorphan (ROBITUSSIN DM) 100-10 MG/5ML syrup 15 mL  15 mL Oral Q4H PRN Esco, Miechia A, MD      . heparin ADULT infusion 100 units/mL (25000 units/21mL sodium chloride 0.45%)  500 Units/hr Intravenous Continuous Esco, Miechia A, MD      . hydrALAZINE (APRESOLINE) injection 5 mg  5 mg  Intravenous Q20 Min PRN Esco, Miechia A, MD      . labetalol (NORMODYNE,TRANDATE) injection 10 mg  10 mg Intravenous Q10 min PRN Esco, Miechia A, MD      . metoprolol tartrate (LOPRESSOR) injection 2-5 mg  2-5 mg Intravenous Q2H PRN Esco, Miechia A, MD      . ondansetron (ZOFRAN) injection 4 mg  4 mg Intravenous Q4H PRN Tukov-Yual, Magdalene S, NP   4 mg at 04-11-2018 0115  . pantoprazole (PROTONIX) 80 mg in sodium chloride 0.9 % 250 mL (0.32 mg/mL) infusion  8 mg/hr Intravenous Continuous Tukov-Yual, Magdalene S, NP 25 mL/hr at 04-11-2018 0216 8 mg/hr at Apr 11, 2018 0216  . [START ON 03/27/2018] pantoprazole (PROTONIX) injection 40 mg  40 mg Intravenous Q12H Tukov-Yual, Magdalene S, NP      . phenol (CHLORASEPTIC) mouth spray 1 spray  1 spray Mouth/Throat PRN Esco, Miechia A, MD      . phenylephrine (NEO-SYNEPHRINE) 10 mg in dextrose 5 % 250 mL (0.04 mg/mL)  infusion  0-400 mcg/min Intravenous Once Tukov-Yual, Magdalene S, NP      . potassium chloride SA (K-DUR,KLOR-CON) CR tablet 20-40 mEq  20-40 mEq Oral Once Esco, Miechia A, MD      . sodium chloride flush 0.9 % injection               Allergies: No Known Allergies    Past Medical History: Past Medical History:  Diagnosis Date  . Aphasia   . Dysphagia   . Hypertension   . Stroke (HCC)   . Vascular dementia      Past Surgical History: Past Surgical History:  Procedure Laterality Date  . PEG PLACEMENT       Family History: Family History  Problem Relation Age of Onset  . Heart failure Mother   . Thyroid cancer Mother   . Suicidality Brother      Social History: Social History   Socioeconomic History  . Marital status: Single    Spouse name: Not on file  . Number of children: Not on file  . Years of education: Not on file  . Highest education level: Not on file  Occupational History  . Not on file  Social Needs  . Financial resource strain: Not on file  . Food insecurity:    Worry: Not on file    Inability: Not on file  . Transportation needs:    Medical: Not on file    Non-medical: Not on file  Tobacco Use  . Smoking status: Former Games developer  . Smokeless tobacco: Never Used  Substance and Sexual Activity  . 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 Sexual activity: Not on file  Lifestyle  . Physical activity:    Days per week: Not on file    Minutes per session: Not on file  . Stress: Not on file  Relationships  . Social connections:    Talks on phone: Not on file    Gets together: Not on file    Attends religious service: Not on file    Active member of club or organization: Not on file    Attends meetings of clubs or organizations: Not on file    Relationship status: Not on file  . Intimate partner violence:    Fear of current or ex partner:  Not on  file    Emotionally abused: Not on file    Physically abused: Not on file    Forced sexual activity: Not on file  Other Topics Concern  . Not on file  Social History Narrative   Resident of [facility]   Able to transfer himself to chair with assistance   Nonverbal at baseline     Review of Systems: not avaialble Gen:  HEENT:  CV:  Resp:  GI: GU :  MS:  Derm:   Psych: Heme:  Neuro:  Endocrine  Vital Signs: Blood pressure 108/78, pulse (!) 123, temperature 99.4 F (37.4 C), temperature source Axillary, resp. rate (!) 30, height 5\' 8"  (1.727 m), weight 50.8 kg (112 lb), SpO2 92 %.   Intake/Output Summary (Last 24 hours) at 03/08/2018 1014 Last data filed at 03/17/2018 0600 Gross per 24 hour  Intake 2600 ml  Output 302 ml  Net 2298 ml    Weight trends: [employer]   08/30/17 1645  Weight: 50.8 kg (112 lb)    Physical Exam: General:  No acute distress, laying in the bed  HEENT  patient is initially, dry oral mucous membranes,  Neck:  No masses  Lungs:  Shallow breathing effort, coarse, nonrebreather mask  Heart::  Regular rhythm, tachycardic  Abdomen:  PEG tube in place  Extremities:  Left leg bluish discoloration, no edema  Neurologic:  Alert, able to follow few simple commands  Skin:  lef leg mottling  Foley:  In place       Lab results: Basic Metabolic Panel: Recent Labs  Lab 08/30/17 1820 08/30/17 2049 02/27/2018 0554  NA 162* 161* 161*  K 5.1 4.2 4.6  CL 127* 128* >130*  CO2 18* 20* 20*  GLUCOSE 133* 160* 144*  BUN 111* 112* 112*  CREATININE 6.67* 6.21* 7.16*  CALCIUM 10.0 9.0 8.5*  MG  --   --  2.4  PHOS  --   --  4.7*    Liver Function Tests: Recent Labs  Lab 03/03/2018 0554  AST 82*  ALT 31  ALKPHOS 65  BILITOT 0.7  PROT 7.0  ALBUMIN 2.8*   No results for input(s): LIPASE, AMYLASE in the last 168 hours. No results for input(s): AMMONIA in the last 168 hours.  CBC: Recent Labs  Lab 08/30/17 1627 08/30/17 2049  03/13/2018 0554  WBC 21.6* 17.0* 13.1*  NEUTROABS 18.5*  --   --   HGB 18.9* 14.9 15.3  HCT 57.1* 46.1 45.9  MCV 103.9* 103.5* 103.1*  PLT 185 118* 110*    Cardiac Enzymes: Recent Labs  Lab 03/02/2018 0554  CKTOTAL 1,197*  TROPONINI 0.15*    BNP: Invalid input(s): POCBNP  CBG: Recent Labs  Lab 03/19/2018 0752  GLUCAP 143*    Microbiology: No results found for this or any previous visit (from the past 720 hour(s)).   Coagulation Studies: Recent Labs    08/30/17 1627 08/30/17 2113 03/17/2018 0554  LABPROT 16.2* 21.0* 18.2*  INR 1.31 1.83 1.52    Urinalysis: Recent Labs    08/30/17 2049  COLORURINE AMBER*  LABSPEC 1.044*  PHURINE 5.0  GLUCOSEU NEGATIVE  HGBUR SMALL*  BILIRUBINUR NEGATIVE  KETONESUR NEGATIVE  PROTEINUR 100*  NITRITE NEGATIVE  LEUKOCYTESUR NEGATIVE        Imaging: Dg Abd 1 View  Result Date: 03/13/2018 CLINICAL DATA:  Nausea and vomiting. EXAM: ABDOMEN - 1 VIEW COMPARISON:  CTA of the abdomen and pelvis 7 hours prior. FINDINGS: Increased colonic air is seen on  prior CT. No small bowel dilatation. Gastrostomy tube in the upper abdomen. No evidence of free air. Atherosclerotic calcifications. Catheter overlies the pelvis decompressing the urinary bladder. The bones appear under mineralized. IMPRESSION: Air-filled colon as seen on recent prior CT without evidence of obstruction. No small bowel dilatation. Electronically Signed   By: Rubye Oaks M.D.   On: 03/09/2018 01:45   Ct Angio Ao+bifem W & Or Wo Contrast  Result Date: 03/31/18 CLINICAL DATA:  Cold left leg, no pulse EXAM: CT ANGIOGRAPHY OF ABDOMINAL AORTA WITH ILIOFEMORAL RUNOFF TECHNIQUE: Multidetector CT imaging of the abdomen, pelvis and lower extremities was performed using the standard protocol during bolus administration of intravenous contrast. Multiplanar CT image reconstructions and MIPs were obtained to evaluate the vascular anatomy. CONTRAST:  ISOVUE-370 IOPAMIDOL  (ISOVUE-370) INJECTION 76% COMPARISON:  CT abdomen pelvis 03/20/2012 FINDINGS: VASCULAR Aorta: Marked atherosclerotic irregularity with extensive calcified and noncalcified plaque. Extensive irregular plaque in the distal descending thoracic aorta posteriorly. No aneurysm. Celiac: Patent SMA: Patent Renals: Occluded IMA: Heavily calcified bilaterally, patent on the left. Very small right renal artery diffusely. RIGHT Lower Extremity Inflow: Heavily calcified right common iliac artery with severe stenosis, greater than 70%. The external iliac is patent. Severely diseased internal iliac. Outflow: Moderate calcifications in the common femoral artery and throughout the superficial femoral artery. Profundus is patent with proximal moderate disease. Moderate disease in the popliteal artery. Occlusion of the distal popliteal artery. Runoff: Reconstitution of portions of the peroneal and posterior tibial arteries in the calf. Non opacification of the posterior tibial artery at the ankle likely due to proximal disease and slow flow. 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 Outflow: Minimal reconstitution of the common femoral artery likely via circumflex iliac branches. Minimal flow in the proximal superficial femoral artery upper profunda. Complete occlusion of the superficial femoral artery below the proximal portion. Popliteal artery is completely occluded. Runoff: No flow noted within the trifurcation vessels in the left calf. Veins: Grossly unremarkable. Review of the MIP images confirms the above findings. NON-VASCULAR Lower chest: Airspace disease posteriorly in the right lower lobe could reflect atelectasis or pneumonia. Left lung base clear. No effusions. Heart is normal size. Hepatobiliary: No focal hepatic abnormality. Gallbladder unremarkable. Pancreas: No focal abnormality or ductal dilatation. Spleen: No focal abnormality.   Normal size. Adrenals/Urinary Tract: Areas of poor enhancement within the kidneys bilaterally, likely related to renovascular disease. Scarring and cortical thinning within the right kidney. No hydronephrosis. Adrenal glands and urinary bladder grossly unremarkable. Stomach/Bowel: Mild gaseous distention of the colon with scattered air-fluid levels which could reflect diarrhea. Appendix is normal. No evidence of bowel obstruction. Stomach and small bowel decompressed, unremarkable. Gastrostomy tube enters the distal stomach. The balloon is noted in the distal stomach or proximal duodenum. Lymphatic: No adenopathy. Reproductive: Prostate enlargement. Other: Hardware noted within the proximal left femur related to remote injury. Degenerative changes in the visualized thoracolumbar spine. Musculoskeletal: IMPRESSION: VASCULAR Severe atherosclerotic disease throughout the aorta. Irregular plaque posteriorly in the distal descending thoracic aorta. Heavily calcified aorta. Heavily calcified mesenteric vessels and renal arteries bilaterally. The right renal artery is severely narrowed diffusely. 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 Minimal reconstitution in the left common femoral artery and profunda with reocclusion of the proximal superficial femoral artery. No flow noted in the left lower extremity below the proximal SFA. Moderate to severe stenosis in the  right common iliac artery, greater than 70%. Heavily calcified external iliac and common femoral and superficial femoral vessels. Complete occlusion of the distal right popliteal artery with reconstitution of portions of the peroneal and posterior tibial arteries in the right calf. NON-VASCULAR Mild gaseous distention of the colon with scattered air-fluid levels. This could be seen with diarrhea. Gastrostomy tube in place. The balloon is within the distal stomach or proximal duodenum. Atrophic right  kidney. Areas of poor enhancement within the kidneys bilaterally, likely related to renovascular disease. Electronically Signed   By: Charlett Nose M.D.   On: 02/28/2018 17:46   Dg Chest Port 1 View  Result Date: 03/22/2018 CLINICAL DATA:  Central line placement EXAM: PORTABLE CHEST 1 VIEW COMPARISON:  09/11/2016 FINDINGS: Right central line is been placed with the tip at the cavoatrial junction. Consolidation in the right lower lobe concerning for pneumonia. Left lung clear. Heart is normal size. No effusions or pneumothorax. IMPRESSION: Right central line placement with the tip at the cavoatrial junction. No pneumothorax. Continued right lower lobe opacity/consolidation. Electronically Signed   By: Charlett Nose M.D.   On: 02/26/2018 20:48      Assessment & Plan: Pt is a 62 y.o. caucasian  male with medical problems of stroke, AVM, dysphagia requiring PEG tube, hypertension, history of hospitalization for sepsis/pneumonia in January 2019, vascular dementia, remote history of IV drug use, who was admitted to [facility] on 03/13/2018 from [facility] (resident for more than 8 to 9 years) secondary to left leg acute ischemia.  Patient underwent emergent left lower extremity revascularization  1.  Acute renal failure Multifactorial but likely dehydration, possible embolic, IV contrast exposure leading to ATN UOP 100 cc  yesterday Patient's overall general health is very poor.   Recommend palliative care for further evaluation and to establish goals of care electrolytes and volume status are acceptable.  No acute indication for dialysis at present Agree with IV hydration   2.  Severe hypernatremia Likely from severe volume depletion and dehydration Agree with D5W after saline bolus is complete Monitor electrolytes frequently to avoid overcorrection of sodium      LOS: 1 Eithen Castiglia Thedore Mins August 22, 201910:14 AM  [facility] [place], [place] [phone]  Note: This note was  prepared with Dragon dictation. Any transcription errors are unintentional

## 2018-03-24 NOTE — Anesthesia Preprocedure Evaluation (Signed)
Anesthesia Evaluation  Patient identified by MRN, date of birth, ID band Patient confused    Reviewed: Allergy & Precautions, H&P , NPO status , Patient's Chart, lab work & pertinent test results  History of Anesthesia Complications Negative for: history of anesthetic complications  Airway Mallampati: III  TM Distance: >3 FB Neck ROM: full    Dental  (+) Dental Advidsory Given   Pulmonary neg pulmonary ROS, neg shortness of breath, former smoker,           Cardiovascular hypertension, (-) angina+ Past MI  (-) DOE      Neuro/Psych PSYCHIATRIC DISORDERS Dementia CVA, Residual Symptoms negative psych ROS   GI/Hepatic Neg liver ROS, Coffee ground emesis overnight   Endo/Other  negative endocrine ROS  Renal/GU      Musculoskeletal   Abdominal   Peds  Hematology negative hematology ROS (+)   Anesthesia Other Findings Past Medical History: No date: Aphasia No date: Dysphagia No date: Hypertension No date: Stroke [facility](HCC)  Past Surgical History: No date: PEG PLACEMENT  BMI    Body Mass Index:  17.03 kg/m      Reproductive/Obstetrics negative OB ROS                             Anesthesia Physical  Anesthesia Plan  ASA: IV  Anesthesia Plan: General   Post-op Pain Management:    Induction: Intravenous  PONV Risk Score and Plan: 2 and Propofol infusion and TIVA  Airway Management Planned: Nasal Cannula  Additional Equipment:   Intra-op Plan:   Post-operative Plan:   Informed Consent: I have reviewed the patients History and Physical, chart, labs and discussed the procedure including the risks, benefits and alternatives for the proposed anesthesia with the patient or authorized representative who has indicated his/her understanding and acceptance.   Dental Advisory Given  Plan Discussed with: Anesthesiologist, CRNA and Surgeon  Anesthesia Plan Comments: (Phone consent from  daughter Raymondo BandKellin Hitbold at [phone]  Plan to suspend DNR for procedure.  Daughter voiced understanding.   Daughter consented for risks of anesthesia including but not limited to:  - adverse reactions to medications - damage to teeth, lips or other oral mucosa - sore throat or hoarseness - Damage to heart, brain, lungs or loss of life  Daughter voiced understanding.)        Anesthesia Quick Evaluation

## 2018-03-24 NOTE — Consult Note (Signed)
ANTICOAGULATION CONSULT NOTE - Initial Consult  Pharmacy Consult for Heparin Drip  Indication: arterial clot  No Known Allergies  Patient Measurements: Height: 5\' 8"  (172.7 cm) Weight: 112 lb (50.8 kg) IBW/kg (Calculated) : 68.4 Vital Signs: Temp: 99.2 F (37.3 C) (07/28 0400) Temp Source: Temporal (07/27 2015) BP: 98/47 (07/28 0530) Pulse Rate: 126 (07/28 0530)  Labs: Recent Labs    01/26/18 1627 01/26/18 1820 01/26/18 2049 01/26/18 2113  HGB 18.9*  --  14.9  --   HCT 57.1*  --  46.1  --   PLT 185  --  118*  --   APTT 26  --   --   --   LABPROT 16.2*  --   --  21.0*  INR 1.31  --   --  1.83  CREATININE  --  6.67* 6.21*  --   CKTOTAL  --   --  443*  --   TROPONINI  --   --  0.12*  --     Estimated Creatinine Clearance: 8.9 mL/min (A) (by C-G formula based on SCr of 6.21 mg/dL (H)).   Medical History: Past Medical History:  Diagnosis Date  . Aphasia   . Dysphagia   . Hypertension   . Stroke (HCC)   . Vascular dementia     Assessment: Pharmacy consulted for heparin dosing and monitoring in 62 yo male with arterial clot  Goal of Therapy:  Heparin level 0.3-0.7 units/ml Monitor platelets by anticoagulation protocol: Yes   Plan:  Baseline labs ordered Give 3000 units bolus x 1 Start heparin infusion at 850 units/hr Check anti-Xa level in 6 hours and daily while on heparin Continue to monitor H&H and platelets   7/27 @ 2020 Pharmacy received consult from Dr. Evie LacksEsco for heparin dosing change. Per Dr. Evie LacksEsco Heparin drip to start and MAINTAIN at a rate of 0200 at a rate of 500units/hr with NO bolus. Will order heparin level 6 hours after infusion starts. CBC with AM labs per protocol.   7/28 heparin d/c by CCM due to coffee-ground emesis.  Erich MontaneMcBane,Laquan Beier S, PharmD, BCPS Clinical Pharmacist 03/19/2018 6:05 AM

## 2018-03-24 NOTE — Progress Notes (Signed)
[employer] [facility] [place] [facility]   PATIENT NAME: Willie Waller    MR#:  [id number]  DATE OF BIRTH:  [birth year]  SUBJECTIVE:  CHIEF COMPLAINT:   Chief Complaint  Patient presents with  . ischemic leg   Came with embolic occlusion and ischemic leg, status post angioplasty by vascular surgery.  Also noted to have severe dehydration and acute renal failure with hyponatremia. Patient is not very communicative due to previous stroke, REVIEW OF SYSTEMS:  Pt is not able to give review of systems due to previous stroke and not communicative.  ROS  DRUG ALLERGIES:  No Known Allergies  VITALS:  Blood pressure 92/65, pulse (!) 125, temperature 99.4 F (37.4 C), temperature source Axillary, resp. rate (!) 46, height 5\' 8"  (1.727 m), weight 53.9 kg (118 lb 13.3 oz), SpO2 92 %.  PHYSICAL EXAMINATION:  GENERAL:  62 y.o.-year-old very thin patient lying in the bed with no acute distress.  EYES: Pupils equal, round, reactive to light and accommodation. No scleral icterus. Extraocular muscles intact.  HEENT: Head atraumatic, normocephalic. Oropharynx and nasopharynx clear. Very dry mucosa. NECK:  Supple, no jugular venous distention. No thyroid enlargement, no tenderness.  LUNGS: Normal breath sounds bilaterally, no wheezing, rales,rhonchi or crepitation. No use of accessory muscles of respiration.  CARDIOVASCULAR: S1, S2 normal. No murmurs, rubs, or gallops.  ABDOMEN: Soft, nontender, nondistended. Bowel sounds present. No organomegaly or mass.  EXTREMITIES: left leg is  mottled and warm now. Right foot with no edema, palpable dorsalis pedis pulse noted.    NEUROLOGIC: nonverbal, right facial droop noted. Not following commands, but seen moving his RUE and RLE  In bed.  PSYCHIATRIC: The patient is alert, non verbal. SKIN: No obvious rash, lesion, or ulcer.    Physical Exam LABORATORY PANEL:   CBC Recent Labs  Lab 03/11/2018 0554  WBC 13.1*  HGB 15.3  HCT 45.9  PLT  110*   ------------------------------------------------------------------------------------------------------------------  Chemistries  Recent Labs  Lab 02/25/2018 0554 03/04/2018 1626  NA 161* 161*  K 4.6 4.3  CL >130* >130*  CO2 20* 17*  GLUCOSE 144* 125*  BUN 112* 117*  CREATININE 7.16* 7.09*  CALCIUM 8.5* 8.2*  MG 2.4 2.3  AST 82*  --   ALT 31  --   ALKPHOS 65  --   BILITOT 0.7  --    ------------------------------------------------------------------------------------------------------------------  Cardiac Enzymes Recent Labs  Lab 20-Apr-2018 2049 02/27/2018 0554  TROPONINI 0.12* 0.15*   ------------------------------------------------------------------------------------------------------------------  RADIOLOGY:  Dg Abd 1 View  Result Date: 03/19/2018 CLINICAL DATA:  Nausea and vomiting. EXAM: ABDOMEN - 1 VIEW COMPARISON:  CTA of the abdomen and pelvis 7 hours prior. FINDINGS: Increased colonic air is seen on prior CT. No small bowel dilatation. Gastrostomy tube in the upper abdomen. No evidence of free air. Atherosclerotic calcifications. Catheter overlies the pelvis decompressing the urinary bladder. The bones appear under mineralized. IMPRESSION: Air-filled colon as seen on recent prior CT without evidence of obstruction. No small bowel dilatation. Electronically Signed   By: Rubye Oaks M.D.   On: 03/18/2018 01:45   Ct Angio Ao+bifem W & Or Wo Contrast  Result Date: Apr 20, 2018 CLINICAL DATA:  Cold left leg, no pulse EXAM: CT ANGIOGRAPHY OF ABDOMINAL AORTA WITH ILIOFEMORAL RUNOFF TECHNIQUE: Multidetector CT imaging of the abdomen, pelvis and lower extremities was performed using the standard protocol during bolus administration of intravenous contrast. Multiplanar CT image reconstructions and MIPs were obtained to evaluate the vascular anatomy. CONTRAST:  ISOVUE-370 IOPAMIDOL (  ISOVUE-370) INJECTION 76% COMPARISON:  CT abdomen pelvis 03/20/2012 FINDINGS: VASCULAR  Aorta: Marked atherosclerotic irregularity with extensive calcified and noncalcified plaque. Extensive irregular plaque in the distal descending thoracic aorta posteriorly. No aneurysm. Celiac: Patent SMA: Patent Renals: Occluded IMA: Heavily calcified bilaterally, patent on the left. Very small right renal artery diffusely. RIGHT Lower Extremity Inflow: Heavily calcified right common iliac artery with severe stenosis, greater than 70%. The external iliac is patent. Severely diseased internal iliac. Outflow: Moderate calcifications in the common femoral artery and throughout the superficial femoral artery. Profundus is patent with proximal moderate disease. Moderate disease in the popliteal artery. Occlusion of the distal popliteal artery. Runoff: Reconstitution of portions of the peroneal and posterior tibial arteries in the calf. Non opacification of the posterior tibial artery at the ankle likely due to proximal disease and slow flow. 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 Outflow: Minimal reconstitution of the common femoral artery likely via circumflex iliac branches. Minimal flow in the proximal superficial femoral artery upper profunda. Complete occlusion of the superficial femoral artery below the proximal portion. Popliteal artery is completely occluded. Runoff: No flow noted within the trifurcation vessels in the left calf. Veins: Grossly unremarkable. Review of the MIP images confirms the above findings. NON-VASCULAR Lower chest: Airspace disease posteriorly in the right lower lobe could reflect atelectasis or pneumonia. Left lung base clear. No effusions. Heart is normal size. Hepatobiliary: No focal hepatic abnormality. Gallbladder unremarkable. Pancreas: No focal abnormality or ductal dilatation. Spleen: No focal abnormality.  Normal size. Adrenals/Urinary Tract: Areas of poor enhancement within the kidneys  bilaterally, likely related to renovascular disease. Scarring and cortical thinning within the right kidney. No hydronephrosis. Adrenal glands and urinary bladder grossly unremarkable. Stomach/Bowel: Mild gaseous distention of the colon with scattered air-fluid levels which could reflect diarrhea. Appendix is normal. No evidence of bowel obstruction. Stomach and small bowel decompressed, unremarkable. Gastrostomy tube enters the distal stomach. The balloon is noted in the distal stomach or proximal duodenum. Lymphatic: No adenopathy. Reproductive: Prostate enlargement. Other: Hardware noted within the proximal left femur related to remote injury. Degenerative changes in the visualized thoracolumbar spine. Musculoskeletal: IMPRESSION: VASCULAR Severe atherosclerotic disease throughout the aorta. Irregular plaque posteriorly in the distal descending thoracic aorta. Heavily calcified aorta. Heavily calcified mesenteric vessels and renal arteries bilaterally. The right renal artery is severely narrowed diffusely. 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 Minimal reconstitution in the left common femoral artery and profunda with reocclusion of the proximal superficial femoral artery. No flow noted in the left lower extremity below the proximal SFA. Moderate to severe stenosis in the right common iliac artery, greater than 70%. Heavily calcified external iliac and common femoral and superficial femoral vessels. Complete occlusion of the distal right popliteal artery with reconstitution of portions of the peroneal and posterior tibial arteries in the right calf. NON-VASCULAR Mild gaseous distention of the colon with scattered air-fluid levels. This could be seen with diarrhea. Gastrostomy tube in place. The balloon is within the distal stomach or proximal duodenum. Atrophic right kidney. Areas of poor enhancement within the kidneys bilaterally, likely related  to renovascular disease. Electronically Signed   By: Charlett Nose M.D.   On: 22-Apr-2018 17:46   Dg Chest Port 1 View  Result Date: 03/21/2018 CLINICAL DATA:  Shortness of breath. EXAM: PORTABLE CHEST 1 VIEW COMPARISON:  2018-04-22 and prior exams FINDINGS: The cardiomediastinal silhouette  is unremarkable. A RIGHT IJ central venous catheter with tip overlying the SUPERIOR cavoatrial junction again noted. RIGHT LOWER lung opacity has slightly improved. No pneumothorax, pleural effusion or new pulmonary opacity. No acute bony abnormalities identified. IMPRESSION: Improved RIGHT LOWER lung opacity/airspace disease. No other significant change. Electronically Signed   By: Harmon PierJeffrey  Hu M.D.   On: 03/19/2018 13:31   Dg Chest Port 1 View  Result Date: 2018/04/13 CLINICAL DATA:  Central line placement EXAM: PORTABLE CHEST 1 VIEW COMPARISON:  09/11/2016 FINDINGS: Right central line is been placed with the tip at the cavoatrial junction. Consolidation in the right lower lobe concerning for pneumonia. Left lung clear. Heart is normal size. No effusions or pneumothorax. IMPRESSION: Right central line placement with the tip at the cavoatrial junction. No pneumothorax. Continued right lower lobe opacity/consolidation. Electronically Signed   By: Charlett NoseKevin  Dover M.D.   On: 02019/08/17 20:48    ASSESSMENT AND PLAN:   Active Problems:   Pain of left lower extremity due to ischemia   Ischemia of left lower extremity  Willie Waller  is a 62 y.o. male with a known history of strokes status post aphasia, history of vascular dementia [facility] secondary to left leg mottling noted today.  * Ischemic left leg-CT angiogram with aortic thrombus with occluded, iliac, external iliac and common femoral arteries -management per vascular team -IV heparin-stopped due to coffee-ground vomiting overnight. - s/p thromboembolectomy -echocardiogram to rule out LV thrombus -anticoagulation at discharge  per vascular team  * History of CVA-also has vascular dementia. Nonverbal at baseline. -Continue peg feeds, dietitian consulted -physical therapy after his surgery -aspirin can be restarted, also on IV heparin drip-stopped now due to GI bleed.  * Leukocytosis-secondary to ischemic event. Continue to monitor and rule out any infection. Could be due to ischemia  *Acute renal failure  Due to severe dehydration IV fluids, nephro consult.  *GI bleed May be ischemia of colon. Stopped heparin drip, GI consult.  * Hypernatremia   Due to dehydration   D5W, monitor.  * Hypertension-   Stopped due to hypotension, pt required Vasopressors briefly.   On fluids now.   Overall prognosis is extremely poor, suggest palliative care involvement.  All the records are reviewed and case discussed with Care Management/Social Workerr. Management plans discussed with the patient, family and they are in agreement.  CODE STATUS: DNR.  TOTAL TIME TAKING CARE OF THIS PATIENT: 35 minutes.     POSSIBLE D/C IN 1-2 DAYS, DEPENDING ON CLINICAL CONDITION.   Altamese DillingVaibhavkumar Nazaiah Navarrete M.D on 03/03/2018   Between 7am to 6pm - Pager - [phone]  After 6pm go to [email or web address] - password [email or web address]  [facility]  Office  [phone]  CC: Primary care physician; System, Pcp Not In  Note: This dictation was prepared with Dragon dictation along with smaller phrase technology. Any transcriptional errors that result from this process are unintentional.

## 2018-03-24 NOTE — Consult Note (Signed)
ANTICOAGULATION CONSULT NOTE - Initial Consult  Pharmacy Consult for Heparin Drip  Indication: arterial clot  No Known Allergies  Patient Measurements: Height: 5\' 8"  (172.7 cm) Weight: 118 lb 13.3 oz (53.9 kg) IBW/kg (Calculated) : 68.4 Vital Signs: Temp: 99.5 F (37.5 C) (07/28 1200) Temp Source: Axillary (07/28 1200) BP: 104/63 (07/28 1600) Pulse Rate: 127 (07/28 1600)  Labs: Recent Labs    02/26/2018 1627 03/02/2018 1820 03/12/2018 2049 02/28/2018 2113 08/03/2018 0554 08/03/2018 1626  HGB 18.9*  --  14.9  --  15.3  --   HCT 57.1*  --  46.1  --  45.9  --   PLT 185  --  118*  --  110*  --   APTT 26  --   --   --  32  --   LABPROT 16.2*  --   --  21.0* 18.2*  --   INR 1.31  --   --  1.83 1.52  --   HEPARINUNFRC  --   --   --   --   --  0.25*  CREATININE  --  6.67* 6.21*  --  7.16*  --   CKTOTAL  --   --  443*  --  1,197*  --   TROPONINI  --   --  0.12*  --  0.15*  --     Estimated Creatinine Clearance: 8.2 mL/min (A) (by C-G formula based on SCr of 7.16 mg/dL (H)).   Medical History: Past Medical History:  Diagnosis Date  . Aphasia   . Dysphagia   . Hypertension   . Stroke (HCC)   . Vascular dementia     Assessment: Pharmacy consulted for heparin dosing and monitoring in 62 yo male with arterial clot  Goal of Therapy:  Heparin level 0.3-0.7 units/ml Monitor platelets by anticoagulation protocol: Yes   Plan:  Baseline labs ordered Give 3000 units bolus x 1 Start heparin infusion at 850 units/hr Check anti-Xa level in 6 hours and daily while on heparin Continue to monitor H&H and platelets   7/27 @ 2020 Pharmacy received consult from Dr. Evie LacksEsco for heparin dosing change. Per Dr. Evie LacksEsco Heparin drip to start and MAINTAIN at a rate of 0200 at a rate of 500units/hr with NO bolus. Will order heparin level 6 hours after infusion starts. CBC with AM labs per protocol.   7/28 heparin d/c by CCM due to coffee-ground emesis.  7/28 1045 Heparin restarted. Pharmacy consulted  for heparin drip dosing and monitoring.   7/28 1700 HL 0.25. Level is subtherapeutic. Will adjust heparin to 600units/hr with no bolus. Recheck HL in 6 hours per protocol.   Gardner CandleSheema M Beldon Nowling, PharmD, BCPS Clinical Pharmacist September 13, 2017 5:19 PM

## 2018-03-24 NOTE — Progress Notes (Signed)
Received a trans-in patient from ICU. Wheeled to the unit per ICU bed accompanied by RN. Patient is non-verbal. Family members present. With O2 supplement at 3LPM/[place]. Foley  Catheter in situ, on to a urobag, urine draining per gravity.  Comfort measures in place. TL CVC to Left IJ in place, with ongoing morphine drip at 5mg /hr. Rest and comforts maintained. Needs attended. Will continue to monitor.

## 2018-03-24 NOTE — Progress Notes (Signed)
Patient ID: Deveron Furlongrnest Huskins, male   DOB: 06/30/1956, 62 y.o.   MRN: [id number] Pulmonary/critical care  Patient's status is deteriorating, increasing respiratory rate, tachycardia and 130s, peripherally mottled left greater than right, marginal saturations 100%. Not a BiPAP candidate secondary to secretions, stroke and difficulty with communication, breathes through his mouth so he did high flow be difficult, will continue nonrebreather mask.will add Unasyn to his regimen. Called his daughter and let her know that his status is poor at this time, she will be coming in soon to outline further goals of care. Presently patient is a DO NOT RESUSCITATE with a prior history of stroke now with status post leg ischemia, upper GI bleed, renal failure, respiratory failure.  Tora KindredJohn Octavia Mottola, D.O.

## 2018-03-25 LAB — ECHOCARDIOGRAM COMPLETE
HEIGHTINCHES: 68 in
Weight: 1901.25 oz

## 2018-03-25 MED FILL — Sodium Chloride IV Soln 0.9%: INTRAVENOUS | Qty: 100 | Status: AC

## 2018-03-25 MED FILL — Morphine Sulf For Microinfusion PF Inj 500 MG/20ML (25MG/ML): INTRAMUSCULAR | Qty: 100 | Status: AC

## 2018-03-26 LAB — HIV ANTIBODY (ROUTINE TESTING W REFLEX): HIV SCREEN 4TH GENERATION: NONREACTIVE

## 2018-03-27 LAB — SURGICAL PATHOLOGY

## 2018-03-27 LAB — LACTATE DEHYDROGENASE, ISOENZYMES
LDH 1: 17 % (ref 17–32)
LDH 2: 27 % (ref 25–40)
LDH 3: 24 % (ref 17–27)
LDH 4: 13 % (ref 5–13)
LDH 5: 19 % (ref 4–20)
LDH ISOENZYMES, TOTAL: 286 IU/L — AB (ref 121–224)

## 2018-03-28 NOTE — Progress Notes (Signed)
   03/06/2018 0330  Clinical Encounter Type  Visited With Patient  Visit Type Follow-up;Spiritual support;Patient actively dying  Referral From Nurse  Consult/Referral To Chaplain  Spiritual Encounters  Spiritual Needs Prayer   Patient was moved to 1C as comfort care. CH reported to the patient's room and prayed for patient and family. The patient was alone but is being monitored closely by RN. CH witnessed the patient breathing shallow and 15 - 20 seconds between breathes. I will continue to monitor and follow up with Willie Waller.

## 2018-03-28 NOTE — Progress Notes (Signed)
Patient noted to have no detectable pulse, no rise and fall or chest. Patient pronounced deaeth by Clinical research associatewriter and fellow RN Trula Orehristina. Charge Nurse, Norwood LevoAC, MD, [place] Donor notified. Family members were called and notified as well. Per [facility], Patient is not a candidate for organ donor.

## 2018-03-28 DEATH — deceased

## 2018-04-16 LAB — BLOOD GAS, VENOUS
ACID-BASE DEFICIT: 10.4 mmol/L — AB (ref 0.0–2.0)
BICARBONATE: 17.2 mmol/L — AB (ref 20.0–28.0)
Patient temperature: 37
pCO2, Ven: 43 mmHg — ABNORMAL LOW (ref 44.0–60.0)
pH, Ven: 7.21 — ABNORMAL LOW (ref 7.250–7.430)

## 2018-04-28 NOTE — Death Summary Note (Signed)
DEATH SUMMARY   Patient Details  Name: Willie Waller MRN: [id number] DOB: Sep 12, 1955  Admission/Discharge Information   Admit Date:  2018-05-15  Date of Death: Date of Death: 03/17/2018  Time of Death: Time of Death: 0540  Length of Stay: 2  Referring Physician: System, Pcp Not In   Reason(s) for Hospitalization  Leg Ischemia  Diagnoses  Preliminary cause of death: Multisystem organ failure. Renal failure. UGI bleeding. Respiratory Failure. Aspiration Secondary Diagnoses (including complications and co-morbidities):  Active Problems:   Pain of left lower extremity due to ischemia   Ischemia of left lower extremity   Brief Hospital Course (including significant findings, care, treatment, and services provided and events leading to death)  Willie Waller is a 62 y.o. year old male with vascular dementia, dysphasia states she is supposed gastrostomy tube placement, and CVA with expressive aphasia who [facility].  He underwent a left iliofemoral thrombectomy  Postprocedure, patient was hypotensive requiring pressors.  He is anuric with a creatinine level of 6.67 up from his baseline of 1.3.  He was having coffee-ground emesis and was admitted to the ICU for further management. Patient's status deteriorated, increasing respiratory rate, tachycardia and 130s, peripherally mottled left greater than right, marginal saturations 100%. Was Not a BiPAP candidate secondary to secretions, stroke and difficulty with communication. Called his daughter and let her know that his status is poor at this time. Family meeting with patient's daughter. Dr. Evie LacksEsco is present along with nurse Related multiple medical issues to include renal failure, status postlimb ischemia, upper gastrointestinal bleeding, prior history of stroke, respiratory distress, probable aspiration pneumonia. Patient is a DO NOT RESUSCITATE with prior history of CVA. Daughter states that he would not  want aggressive care. We discussed comfort care measures and she is agreeable. Patient was place on a morphine infusion, as needed benzodiazepine and keep patient comfortable. Chaplain was notified Time of Death [id number]   Pertinent Labs and Studies  Significant Diagnostic Studies Dg Abd 1 View  Result Date: 03/18/2018 CLINICAL DATA:  Nausea and vomiting. EXAM: ABDOMEN - 1 VIEW COMPARISON:  CTA of the abdomen and pelvis 7 hours prior. FINDINGS: Increased colonic air is seen on prior CT. No small bowel dilatation. Gastrostomy tube in the upper abdomen. No evidence of free air. Atherosclerotic calcifications. Catheter overlies the pelvis decompressing the urinary bladder. The bones appear under mineralized. IMPRESSION: Air-filled colon as seen on recent prior CT without evidence of obstruction. No small bowel dilatation. Electronically Signed   By: Rubye OaksMelanie  Ehinger M.D.   On: 03/18/2018 01:45   Ct Angio Ao+bifem W & Or Wo Contrast  Result Date: 2018-05-15 CLINICAL DATA:  Cold left leg, no pulse EXAM: CT ANGIOGRAPHY OF ABDOMINAL AORTA WITH ILIOFEMORAL RUNOFF TECHNIQUE: Multidetector CT imaging of the abdomen, pelvis and lower extremities was performed using the standard protocol during bolus administration of intravenous contrast. Multiplanar CT image reconstructions and MIPs were obtained to evaluate the vascular anatomy. CONTRAST:  100mL ISOVUE-370 IOPAMIDOL (ISOVUE-370) INJECTION 76% COMPARISON:  CT abdomen pelvis 03/20/2012 FINDINGS: VASCULAR Aorta: Marked atherosclerotic irregularity with extensive calcified and noncalcified plaque. Extensive irregular plaque in the distal descending thoracic aorta posteriorly. No aneurysm. Celiac: Patent SMA: Patent Renals: Occluded IMA: Heavily calcified bilaterally, patent on the left. Very small right renal artery diffusely. RIGHT Lower Extremity Inflow: Heavily calcified right common iliac artery with severe stenosis, greater than 70%. The external iliac is patent.  Severely diseased internal iliac. Outflow: Moderate calcifications in the common  femoral artery and throughout the superficial femoral artery. Profundus is patent with proximal moderate disease. Moderate disease in the popliteal artery. Occlusion of the distal popliteal artery. Runoff: Reconstitution of portions of the peroneal and posterior tibial arteries in the calf. Non opacification of the posterior tibial artery at the ankle likely due to proximal disease and slow flow. 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 Outflow: Minimal reconstitution of the common femoral artery likely via circumflex iliac branches. Minimal flow in the proximal superficial femoral artery upper profunda. Complete occlusion of the superficial femoral artery below the proximal portion. Popliteal artery is completely occluded. Runoff: No flow noted within the trifurcation vessels in the left calf. Veins: Grossly unremarkable. Review of the MIP images confirms the above findings. NON-VASCULAR Lower chest: Airspace disease posteriorly in the right lower lobe could reflect atelectasis or pneumonia. Left lung base clear. No effusions. Heart is normal size. Hepatobiliary: No focal hepatic abnormality. Gallbladder unremarkable. Pancreas: No focal abnormality or ductal dilatation. Spleen: No focal abnormality.  Normal size. Adrenals/Urinary Tract: Areas of poor enhancement within the kidneys bilaterally, likely related to renovascular disease. Scarring and cortical thinning within the right kidney. No hydronephrosis. Adrenal glands and urinary bladder grossly unremarkable. Stomach/Bowel: Mild gaseous distention of the colon with scattered air-fluid levels which could reflect diarrhea. Appendix is normal. No evidence of bowel obstruction. Stomach and small bowel decompressed, unremarkable. Gastrostomy tube enters the distal stomach. The balloon is noted in the  distal stomach or proximal duodenum. Lymphatic: No adenopathy. Reproductive: Prostate enlargement. Other: Hardware noted within the proximal left femur related to remote injury. Degenerative changes in the visualized thoracolumbar spine. Musculoskeletal: IMPRESSION: VASCULAR Severe atherosclerotic disease throughout the aorta. Irregular plaque posteriorly in the distal descending thoracic aorta. Heavily calcified aorta. Heavily calcified mesenteric vessels and renal arteries bilaterally. The right renal artery is severely narrowed diffusely. 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 Minimal reconstitution in the left common femoral artery and profunda with reocclusion of the proximal superficial femoral artery. No flow noted in the left lower extremity below the proximal SFA. Moderate to severe stenosis in the right common iliac artery, greater than 70%. Heavily calcified external iliac and common femoral and superficial femoral vessels. Complete occlusion of the distal right popliteal artery with reconstitution of portions of the peroneal and posterior tibial arteries in the right calf. NON-VASCULAR Mild gaseous distention of the colon with scattered air-fluid levels. This could be seen with diarrhea. Gastrostomy tube in place. The balloon is within the distal stomach or proximal duodenum. Atrophic right kidney. Areas of poor enhancement within the kidneys bilaterally, likely related to renovascular disease. Electronically Signed   By: Charlett Nose M.D.   On: 02/28/2018 17:46   Dg Chest Port 1 View  Result Date: 04/10/2018 CLINICAL DATA:  Shortness of breath. EXAM: PORTABLE CHEST 1 VIEW COMPARISON:  02/27/2018 and prior exams FINDINGS: The cardiomediastinal silhouette is unremarkable. A RIGHT IJ central venous catheter with tip overlying the SUPERIOR cavoatrial junction again noted. RIGHT LOWER lung opacity has slightly improved. No pneumothorax,  pleural effusion or new pulmonary opacity. No acute bony abnormalities identified. IMPRESSION: Improved RIGHT LOWER lung opacity/airspace disease. No other significant change. Electronically Signed   By: Harmon Pier M.D.   On: April 10, 2018 13:31   Dg Chest Port 1 View  Result Date: 03/08/2018 CLINICAL DATA:  Central line placement EXAM: PORTABLE CHEST 1 VIEW COMPARISON:  09/11/2016 FINDINGS: Right central line is been placed with the tip at the cavoatrial junction. Consolidation in the right lower lobe concerning for pneumonia. Left lung clear. Heart is normal size. No effusions or pneumothorax. IMPRESSION: Right central line placement with the tip at the cavoatrial junction. No pneumothorax. Continued right lower lobe opacity/consolidation. Electronically Signed   By: Charlett NoseKevin  Dover M.D.   On: 11-07-17 20:48    Microbiology No results found for this or any previous visit (from the past 240 hour(s)).  Lab Basic Metabolic Panel: No results for input(s): NA, K, CL, CO2, GLUCOSE, BUN, CREATININE, CALCIUM, MG, PHOS in the last 168 hours. Liver Function Tests: No results for input(s): AST, ALT, ALKPHOS, BILITOT, PROT, ALBUMIN in the last 168 hours. No results for input(s): LIPASE, AMYLASE in the last 168 hours. No results for input(s): AMMONIA in the last 168 hours. CBC: No results for input(s): WBC, NEUTROABS, HGB, HCT, MCV, PLT in the last 168 hours. Cardiac Enzymes: No results for input(s): CKTOTAL, CKMB, CKMBINDEX, TROPONINI in the last 168 hours. Sepsis Labs: No results for input(s): PROCALCITON, WBC, LATICACIDVEN in the last 168 hours.  Procedures/Operations  Thromboembolectomy   Cordell Guercio 04/12/2018, 12:49 PM

## 2020-02-11 IMAGING — DX DG CHEST 1V PORT
1 series · 2 of 2 positions shown · non-contrast
Comparison: 03/23/2018 and prior exams

CLINICAL DATA: Shortness of breath.

EXAM:
PORTABLE CHEST 1 VIEW

[Series 1: chest ap · 0.14mm/px · 2 of 2 slices shown]
[im 1/2]
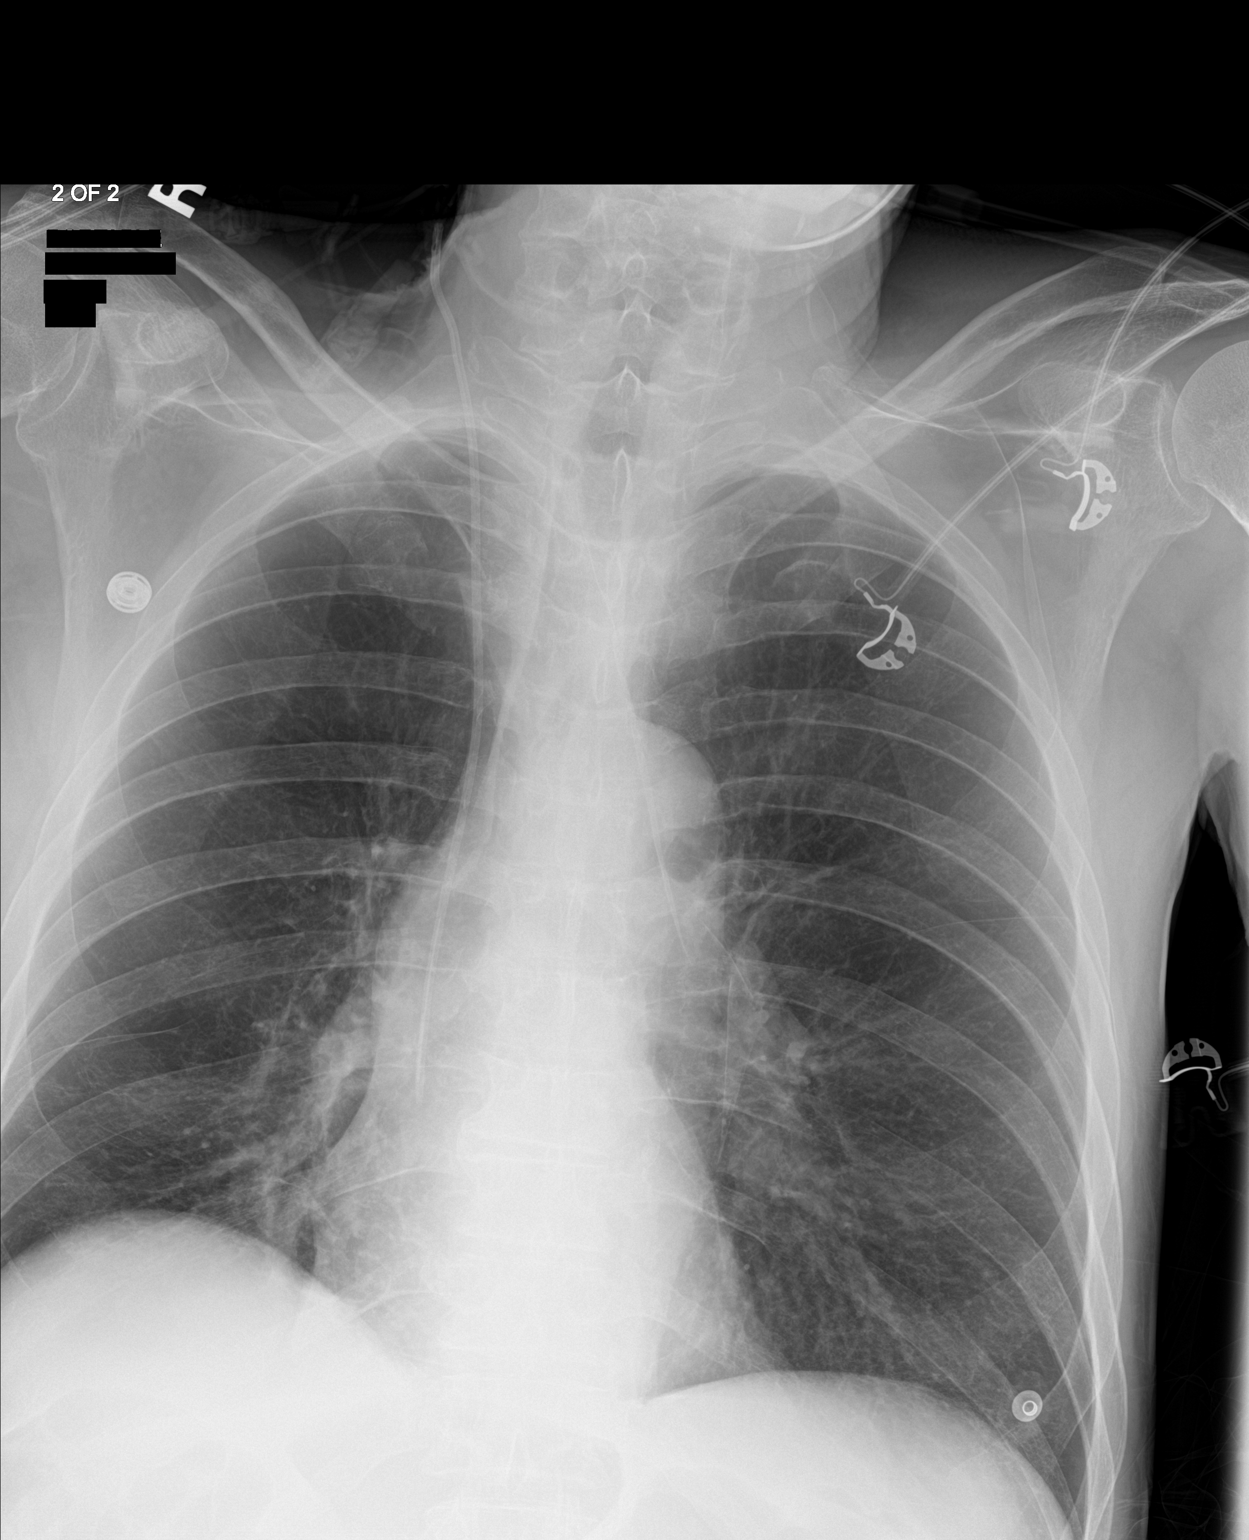
[im 2/2]
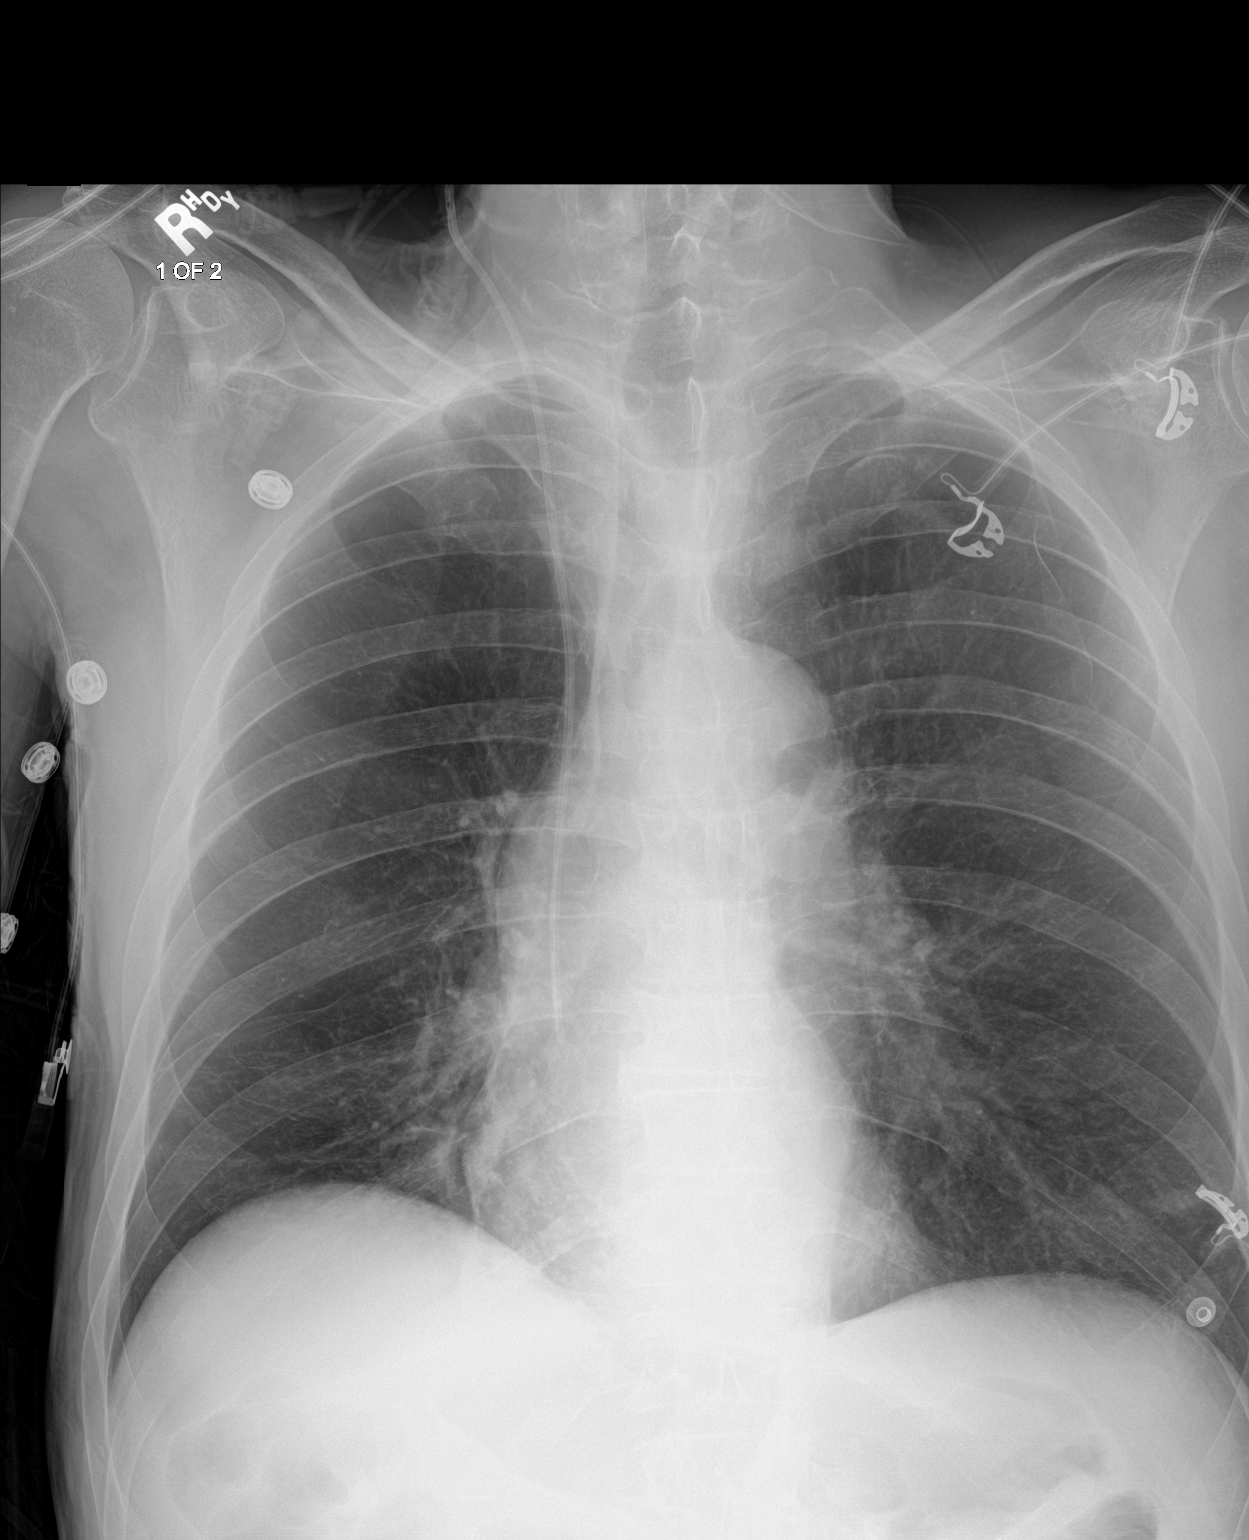

[2 of 2 positions shown; findings below may reference images not displayed]

FINDINGS: The cardiomediastinal silhouette is unremarkable.

A RIGHT IJ central venous catheter with tip overlying the SUPERIOR
cavoatrial junction again noted.

RIGHT LOWER lung opacity has slightly improved.

No pneumothorax, pleural effusion or new pulmonary opacity.

No acute bony abnormalities identified.
IMPRESSION: Improved RIGHT LOWER lung opacity/airspace disease. No other
significant change.

## 2020-02-11 IMAGING — DX DG ABDOMEN 1V
2 series · 2 of 2 positions shown · non-contrast
Comparison: CTA of the abdomen and pelvis 7 hours prior.

CLINICAL DATA: Nausea and vomiting.

EXAM:
ABDOMEN - 1 VIEW

[abdomen kub (1 of 2)]
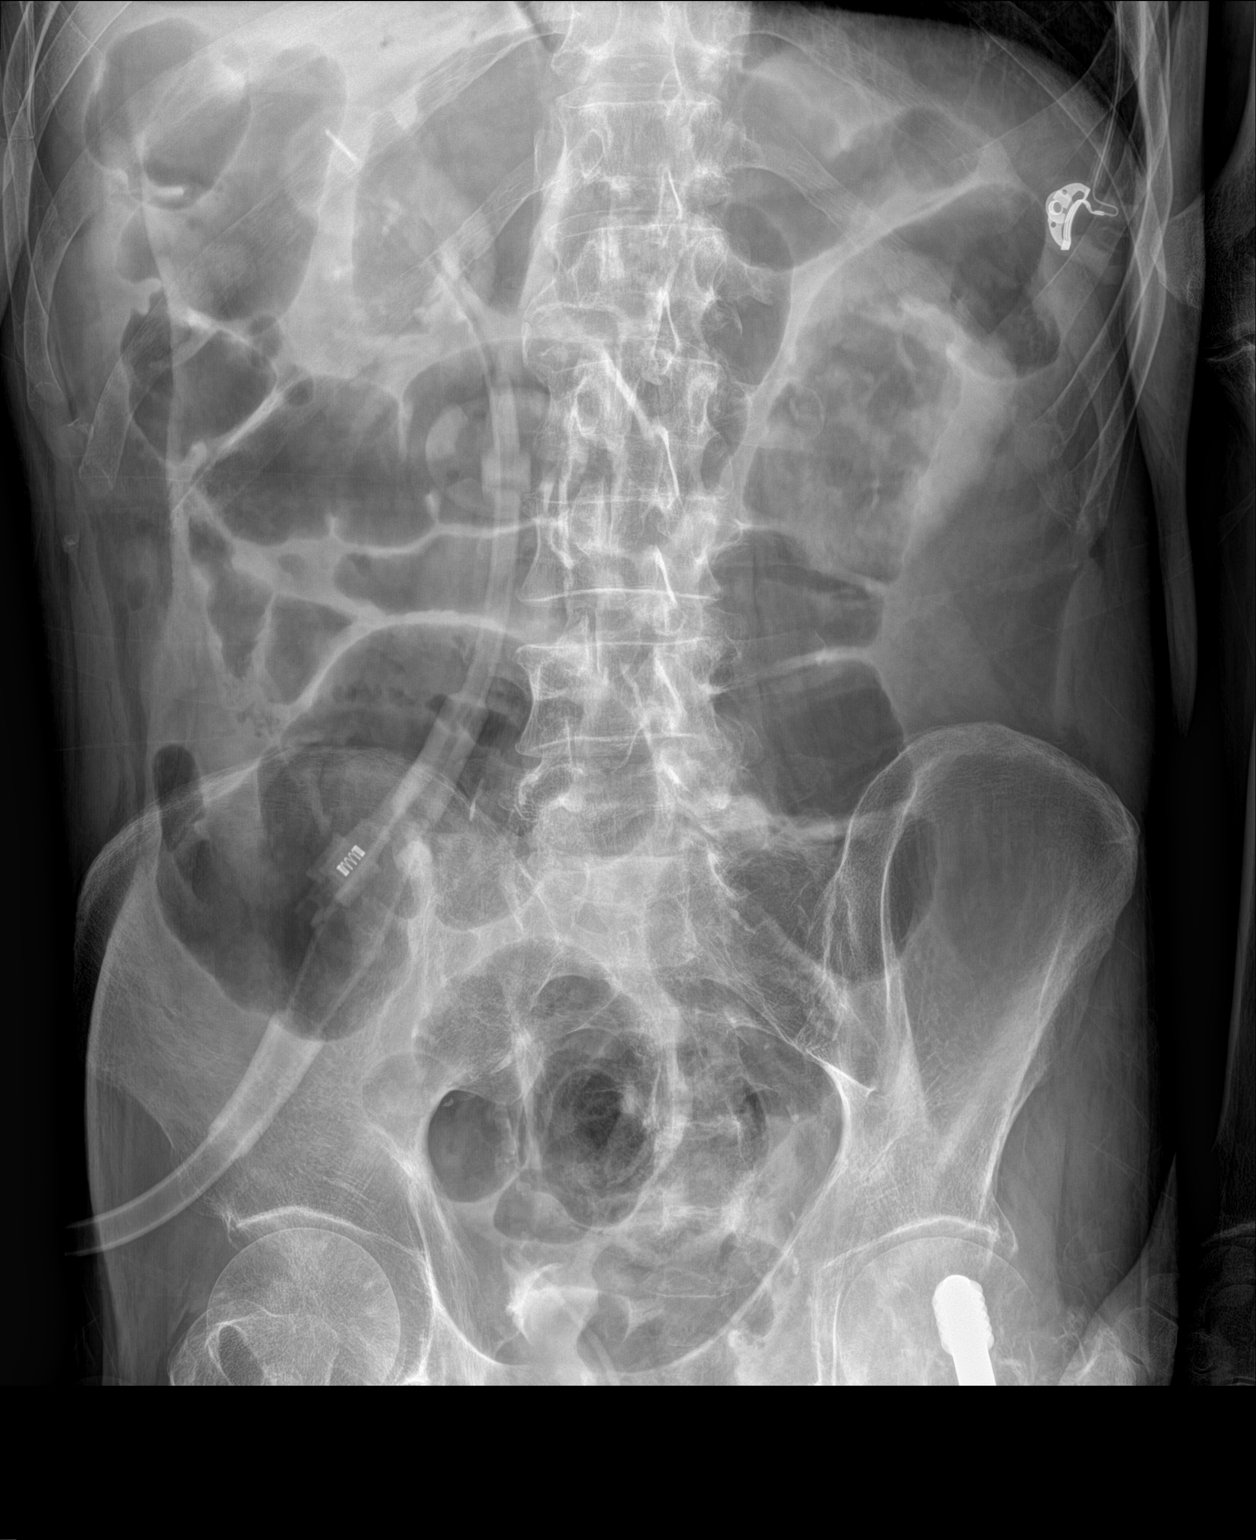

[abdomen kub (2 of 2)]
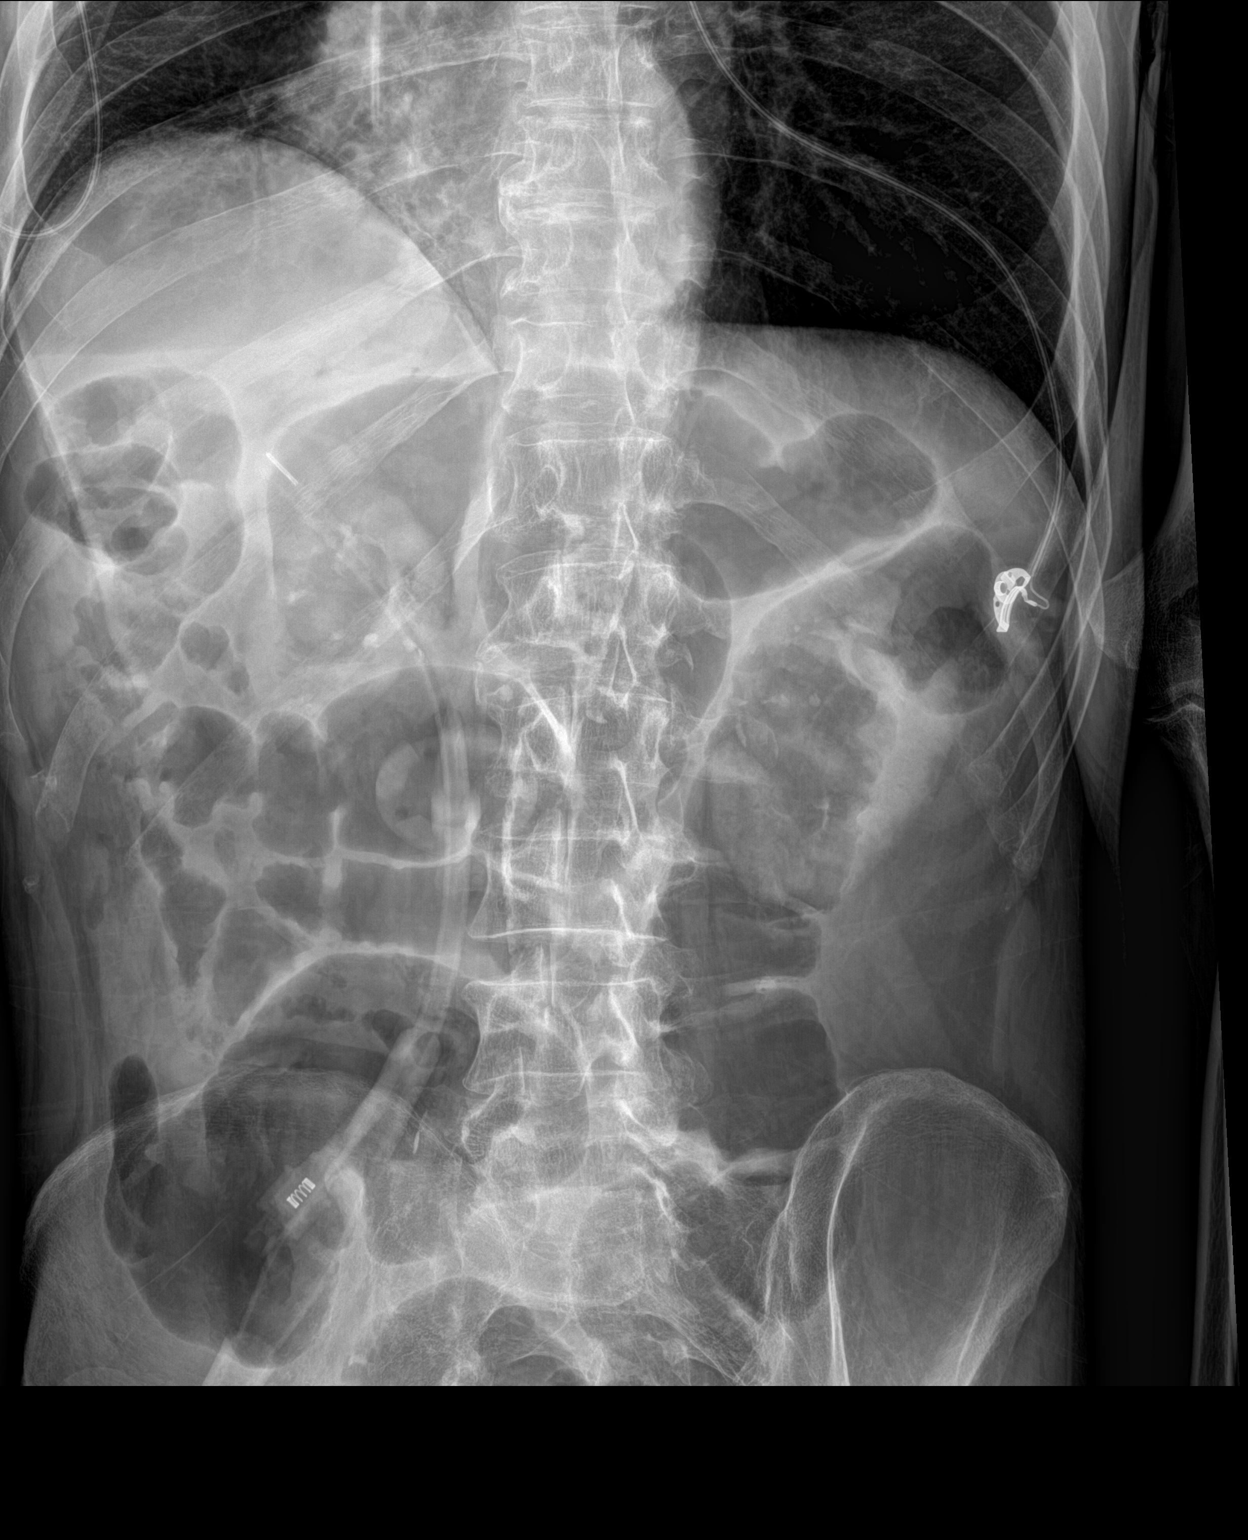

[2 of 2 positions shown; findings below may reference images not displayed]

FINDINGS: Increased colonic air is seen on prior CT. No small bowel
dilatation. Gastrostomy tube in the upper abdomen. No evidence of
free air. Atherosclerotic calcifications. Catheter overlies the
pelvis decompressing the urinary bladder. The bones appear under
mineralized.
IMPRESSION: Air-filled colon as seen on recent prior CT without evidence of
obstruction. No small bowel dilatation.
